# Patient Record
Sex: Male | Born: 1940 | Race: White | Hispanic: No | Marital: Married | Smoking: Former smoker
Health system: Southern US, Community
[De-identification: ages and names within clinical notes are randomized; demographics above are authoritative.]

## PROBLEM LIST (undated history)

## (undated) DIAGNOSIS — K219 Gastro-esophageal reflux disease without esophagitis: Secondary | ICD-10-CM

## (undated) DIAGNOSIS — D3500 Benign neoplasm of unspecified adrenal gland: Secondary | ICD-10-CM

## (undated) DIAGNOSIS — I10 Essential (primary) hypertension: Secondary | ICD-10-CM

## (undated) DIAGNOSIS — G709 Myoneural disorder, unspecified: Secondary | ICD-10-CM

## (undated) DIAGNOSIS — Z8489 Family history of other specified conditions: Secondary | ICD-10-CM

## (undated) DIAGNOSIS — K648 Other hemorrhoids: Secondary | ICD-10-CM

## (undated) DIAGNOSIS — M1990 Unspecified osteoarthritis, unspecified site: Secondary | ICD-10-CM

## (undated) DIAGNOSIS — A0472 Enterocolitis due to Clostridium difficile, not specified as recurrent: Secondary | ICD-10-CM

## (undated) DIAGNOSIS — E7800 Pure hypercholesterolemia, unspecified: Secondary | ICD-10-CM

## (undated) HISTORY — PX: COLONOSCOPY: SHX174

## (undated) HISTORY — DX: Benign neoplasm of unspecified adrenal gland: D35.00

## (undated) HISTORY — DX: Enterocolitis due to Clostridium difficile, not specified as recurrent: A04.72

## (undated) HISTORY — DX: Other hemorrhoids: K64.8

---

## 2008-06-20 ENCOUNTER — Ambulatory Visit: Payer: Self-pay | Admitting: Gastroenterology

## 2009-06-05 ENCOUNTER — Ambulatory Visit: Payer: Self-pay | Admitting: Internal Medicine

## 2009-06-05 ENCOUNTER — Encounter: Admission: RE | Admit: 2009-06-05 | Discharge: 2009-06-05 | Payer: Self-pay | Admitting: Internal Medicine

## 2009-08-04 ENCOUNTER — Ambulatory Visit: Payer: Self-pay | Admitting: Internal Medicine

## 2009-12-08 ENCOUNTER — Ambulatory Visit: Payer: Self-pay | Admitting: Internal Medicine

## 2009-12-25 ENCOUNTER — Ambulatory Visit: Payer: Self-pay | Admitting: Pulmonary Disease

## 2009-12-25 DIAGNOSIS — R519 Headache, unspecified: Secondary | ICD-10-CM | POA: Insufficient documentation

## 2009-12-25 DIAGNOSIS — E785 Hyperlipidemia, unspecified: Secondary | ICD-10-CM

## 2009-12-25 DIAGNOSIS — R51 Headache: Secondary | ICD-10-CM

## 2009-12-25 DIAGNOSIS — I10 Essential (primary) hypertension: Secondary | ICD-10-CM

## 2009-12-25 DIAGNOSIS — J309 Allergic rhinitis, unspecified: Secondary | ICD-10-CM

## 2010-01-12 ENCOUNTER — Encounter: Payer: Self-pay | Admitting: Pulmonary Disease

## 2010-01-14 ENCOUNTER — Ambulatory Visit: Payer: Self-pay | Admitting: Pulmonary Disease

## 2010-01-22 DIAGNOSIS — G4733 Obstructive sleep apnea (adult) (pediatric): Secondary | ICD-10-CM

## 2010-06-08 ENCOUNTER — Ambulatory Visit: Payer: Self-pay | Admitting: Internal Medicine

## 2010-07-07 ENCOUNTER — Encounter
Admission: RE | Admit: 2010-07-07 | Discharge: 2010-09-01 | Payer: Self-pay | Source: Home / Self Care | Attending: Internal Medicine | Admitting: Internal Medicine

## 2010-09-01 NOTE — Assessment & Plan Note (Signed)
Summary: consult for possible osa   Copy to:  Marlan Palau Primary Provider/Referring Provider:  Marlan Palau  CC:  Sleep Consult.  History of Present Illness: The pt is a 70y/o male who I have been asked to see for possible osa.  He has been noted to have loud snoring, but no one has mentioned an abnormal breathing pattern during sleep.  He denies any choking arousals. The patient goes to bed at 11 PM, and arises at 6 AM to start his day. He feels that he is rested at least 80% of the time, but does note a very dry mouth. He denies any significant sleep pressure or alertness issues during the day. He does frequently take an afternoon nap, but it is not something that is a requirement. He denies any sleepiness issues in the evenings while watching television or reading, and has no issues with driving. He states that his weight is 5 pounds less from 2 years ago. His sleepiness score today is borderline at 10.  Preventive Screening-Counseling & Management  Alcohol-Tobacco     Smoking Status: quit  Current Medications (verified): 1)  Diovan Hct 160-12.5 Mg Tabs (Valsartan-Hydrochlorothiazide) .... Take 1 Tablet By Mouth Once A Day 2)  Lipitor 20 Mg Tabs (Atorvastatin Calcium) .... Take 1 Tablet By Mouth Once A Day 3)  Dovonex 0.005 % Crea (Calcipotriene) .... Apply To Rash Two Times A Day 4)  Triamcinolone Acetonide 0.1 % Crea (Triamcinolone Acetonide) .... Apply To Rash Two Times A Day As Needed 5)  Fluticasone Propionate 50 Mcg/act Susp (Fluticasone Propionate) .Marland Kitchen.. 1 Spray in Each Nostril Once Daily 6)  Multivitamins  Tabs (Multiple Vitamin) .... Take 1 Tablet By Mouth Once A Day 7)  Glucosamine-Chondroitin  Caps (Glucosamine-Chondroit-Vit C-Mn) .... Take 1 Tablet By Mouth Two Times A Day 8)  Fish Oil 1000 Mg Caps (Omega-3 Fatty Acids) .... Take 1 Tablet By Mouth Three Times A Day 9)  Advil Pm 200-38 Mg Tabs (Ibuprofen-Diphenhydramine Cit) .... Take 1 Tab By Mouth At Bedtime As Needed 10)   Aspirin 81 Mg Tbec (Aspirin) .... Take By Mouth As Needed 11)  Vitamin D3 1000 Unit Caps (Cholecalciferol) .... Take 1 Tablet By Mouth Once A Day  Allergies (verified): No Known Drug Allergies  Past History:  Past Medical History:  HEADACHE, CHRONIC (ICD-784.0) HYPERTENSION (ICD-401.9) HYPERLIPIDEMIA (ICD-272.4) ALLERGIC RHINITIS (ICD-477.9)    Past Surgical History: none per pt  Family History: Reviewed history and no changes required. emphysema: grandfather cancer: sister (gallbladder)   Social History: Reviewed history and no changes required. Patient states former smoker.  started at age 59.  1 to 1 1/2 ppd.  quit 1983. pt is married. pt has children. pt is a retired Banker.  Smoking Status:  quit  Review of Systems       The patient complains of non-productive cough, tooth/dental problems, headaches, nasal congestion/difficulty breathing through nose, itching, and joint stiffness or pain.  The patient denies shortness of breath with activity, shortness of breath at rest, productive cough, coughing up blood, chest pain, irregular heartbeats, acid heartburn, indigestion, loss of appetite, weight change, abdominal pain, difficulty swallowing, sore throat, sneezing, ear ache, anxiety, depression, hand/feet swelling, rash, change in color of mucus, and fever.    Vital Signs:  Patient profile:   69 year old male Height:      71 inches Weight:      169 pounds BMI:     23.66 O2 Sat:      94 %  on Room air Temp:     97.8 degrees F oral Pulse rate:   87 / minute BP sitting:   120 / 78  (left arm) Cuff size:   regular  Vitals Entered By: Arman Filter LPN (Dec 25, 2009 11:29 AM)  O2 Flow:  Room air CC: Sleep Consult Comments Medications reviewed with patient Arman Filter LPN  Dec 25, 2009 11:29 AM    Physical Exam  General:  ow male in nad Eyes:  PERRLA and EOMI.   Nose:  deviated septum to left with narrowing Mouth:  elongation of uvula, normal  soft palate Neck:  no jvd, tmg, LN Lungs:  clear to auscultation Heart:  rrr, no mrg Abdomen:  soft and nontender, bs+ Extremities:  no edema or cyanosis pulses intact distally Neurologic:  alert and oriented, moves all 4.   Impression & Recommendations:  Problem # 1:  SNORING (ICD-786.09) the pt has loud snoring, but it is unclear if he has clinically significant osa.  He is not overly sleepy during the day, and feels that he has restorative sleep each am.  My suspicion overall is low, but we can easily screen him at home with apnea link device.  I have had a long discussion with the pt about sleep apnea, including its impact on QOL and CV health.  Will let him know results when available.  I have encouraged him to work on weight loss.  Medications Added to Medication List This Visit: 1)  Diovan Hct 160-12.5 Mg Tabs (Valsartan-hydrochlorothiazide) .... Take 1 tablet by mouth once a day 2)  Lipitor 20 Mg Tabs (Atorvastatin calcium) .... Take 1 tablet by mouth once a day 3)  Dovonex 0.005 % Crea (Calcipotriene) .... Apply to rash two times a day 4)  Triamcinolone Acetonide 0.1 % Crea (Triamcinolone acetonide) .... Apply to rash two times a day as needed 5)  Fluticasone Propionate 50 Mcg/act Susp (Fluticasone propionate) .Marland Kitchen.. 1 spray in each nostril once daily 6)  Multivitamins Tabs (Multiple vitamin) .... Take 1 tablet by mouth once a day 7)  Glucosamine-chondroitin Caps (Glucosamine-chondroit-vit c-mn) .... Take 1 tablet by mouth two times a day 8)  Fish Oil 1000 Mg Caps (Omega-3 fatty acids) .... Take 1 tablet by mouth three times a day 9)  Advil Pm 200-38 Mg Tabs (Ibuprofen-diphenhydramine cit) .... Take 1 tab by mouth at bedtime as needed 10)  Aspirin 81 Mg Tbec (Aspirin) .... Take by mouth as needed 11)  Vitamin D3 1000 Unit Caps (Cholecalciferol) .... Take 1 tablet by mouth once a day  Other Orders: Consultation Level IV (16109) Misc. Referral (Misc. Ref)  Patient  Instructions: 1)  will check screening home sleep study to rule out sleep apnea.  I will call you with results. 2)  work on weight loss 3)  stay off back while sleeping if you wish to minimize snoring.

## 2010-09-01 NOTE — Assessment & Plan Note (Signed)
Summary: apnea link shows mild osa with AHI 11/hr.   Copy to:  Marlan Palau Primary Provider/Referring Provider:  Marlan Palau   History of Present Illness: The pt is being evaluated for possible osa with apnea link device.  This is his third attempt, with the previous being prematurely aborted due to the patient pulling the sensors off.  His most recent attempt shows adequate evaluation period.  1) flow evaluation period of 5hrs and 2) 7 obstructive apneas were noted, along with 54 hypopneas.  His AHI was 11/hr. 3) low sat 82%, but no prolonged desaturation below 88%.  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  I have explained to him this is not a significant health risk for him, and therefore he could take some time to work on weight loss if this is not overly impacting his QOL.  He could also consider surgery, dental appliance, and cpap.  He would like to work on weight loss for the next 6mos, and will give me updates on his progress.  Orders: Sleep Std Airflow/Heartrate and O2 SAT unattended (16073)

## 2010-09-28 ENCOUNTER — Ambulatory Visit: Payer: Self-pay | Admitting: Internal Medicine

## 2010-12-10 ENCOUNTER — Ambulatory Visit (INDEPENDENT_AMBULATORY_CARE_PROVIDER_SITE_OTHER): Payer: Medicare Other | Admitting: Internal Medicine

## 2010-12-10 ENCOUNTER — Ambulatory Visit: Payer: BC Managed Care – PPO | Admitting: Internal Medicine

## 2010-12-10 ENCOUNTER — Encounter: Payer: Self-pay | Admitting: Internal Medicine

## 2010-12-10 VITALS — BP 116/84 | HR 76 | Temp 97.1°F | Wt 203.0 lb

## 2010-12-10 DIAGNOSIS — L408 Other psoriasis: Secondary | ICD-10-CM

## 2010-12-10 DIAGNOSIS — L409 Psoriasis, unspecified: Secondary | ICD-10-CM

## 2010-12-10 DIAGNOSIS — E119 Type 2 diabetes mellitus without complications: Secondary | ICD-10-CM | POA: Insufficient documentation

## 2010-12-10 DIAGNOSIS — L219 Seborrheic dermatitis, unspecified: Secondary | ICD-10-CM

## 2010-12-10 DIAGNOSIS — R29898 Other symptoms and signs involving the musculoskeletal system: Secondary | ICD-10-CM | POA: Insufficient documentation

## 2010-12-10 DIAGNOSIS — M6281 Muscle weakness (generalized): Secondary | ICD-10-CM

## 2010-12-10 DIAGNOSIS — E785 Hyperlipidemia, unspecified: Secondary | ICD-10-CM

## 2010-12-10 LAB — HEMOGLOBIN A1C: Mean Plasma Glucose: 117 mg/dL — ABNORMAL HIGH (ref <117)

## 2010-12-10 MED ORDER — ATORVASTATIN CALCIUM 20 MG PO TABS: 20.0000 mg | ORAL_TABLET | Freq: Every day | ORAL | Status: DC

## 2010-12-10 NOTE — Progress Notes (Signed)
  Subjective:    Patient ID: Manuel [name], male    DOB: [birth year], 70 y.o.   MRN: [id number]  HPI 70 year old W male with Hypertension, Hyperlipidemia, AODM for 6 month recheck.  BP stable on Diovan HCT 160/12.5 mg daily, Lipitor 20 mg daily and diet controlled DM. Accuchecks have been excellent. He has lost 15 pounds since Nov. 2011. Has cut down on ice cream. 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  Also, history of nonspecific neuromuscular disease. Cannot close right hand completely. Cannot grip with Right hand.  Does mow his lawn. Left hand works fine. Has not wanted to pursue further evaluation.  Also, psoriasis and seborrhea of ears and face.    Review of Systems  Eyes: Negative for visual disturbance.  Respiratory: Negative for shortness of breath.   Cardiovascular: Negative for chest pain.  Musculoskeletal: Negative for gait problem.  Neurological: Positive for weakness. Negative for tremors, facial asymmetry, speech difficulty, light-headedness, numbness and headaches.  Psychiatric/Behavioral: Negative for confusion, dysphoric mood, decreased concentration and agitation.       Objective:   Physical Exam  Constitutional: He is oriented to person, place, and time. He appears well-developed and well-nourished.  Cardiovascular: Normal rate, regular rhythm and normal heart sounds.  Exam reveals no gallop.   No murmur heard. Pulmonary/Chest: No respiratory distress. He has no wheezes. He has no rales.  Neurological: He is alert and oriented to person, place, and time. He has normal reflexes. No cranial nerve deficit. He exhibits normal muscle tone.  Skin: Rash noted.  Psychiatric: He has a normal mood and affect. His behavior is normal. Judgment and thought content normal.     Psoriatic rash on elbows, seborrhea on face and ears controlled with Devonex.  No change in neuro exam other then cannot grip with right hand and cannot close it at all. No diabetic retinopathy. No diabetic  fotr problems.     Assessment & Plan:  Hyperlipidemia- lipid panel and liver functions drawn and are pending on Lipitor 20 mg daily AODM- diet controlled AIC pending              Hypertension well controlled on Diovan/HCT Non-specific neuromuscular disease right upper extremity  Recent root canal  -failed and tooth was extracted yesterday  RTC in 6 months. Doing well. Immunizations up to date. Will get flu vaccine in 6 months.

## 2010-12-11 LAB — LIPID PANEL
Cholesterol: 164 mg/dL (ref 0–200)
HDL: 41 mg/dL (ref >39)
LDL Cholesterol: 95 mg/dL (ref 0–99)
Total CHOL/HDL Ratio: 4 Ratio
Triglycerides: 139 mg/dL (ref <150)
VLDL: 28 mg/dL (ref 0–40)

## 2010-12-11 LAB — HEPATIC FUNCTION PANEL
Albumin: 4.7 g/dL (ref 3.5–5.2)
Alkaline Phosphatase: 59 U/L (ref 39–117)
Total Bilirubin: 1.2 mg/dL (ref 0.3–1.2)

## 2010-12-17 ENCOUNTER — Ambulatory Visit: Payer: BC Managed Care – PPO | Admitting: Internal Medicine

## 2010-12-17 ENCOUNTER — Ambulatory Visit: Payer: Medicare Other | Admitting: Internal Medicine

## 2011-04-05 ENCOUNTER — Other Ambulatory Visit: Payer: Self-pay | Admitting: Internal Medicine

## 2011-05-06 ENCOUNTER — Encounter: Payer: Self-pay | Admitting: Internal Medicine

## 2011-06-11 ENCOUNTER — Other Ambulatory Visit: Payer: Medicare Other | Admitting: Internal Medicine

## 2011-06-11 DIAGNOSIS — E785 Hyperlipidemia, unspecified: Secondary | ICD-10-CM

## 2011-06-11 DIAGNOSIS — I10 Essential (primary) hypertension: Secondary | ICD-10-CM

## 2011-06-11 DIAGNOSIS — R350 Frequency of micturition: Secondary | ICD-10-CM

## 2011-06-11 LAB — CBC WITH DIFFERENTIAL/PLATELET
Basophils Absolute: 0 10*3/uL (ref 0.0–0.1)
Lymphocytes Relative: 30 % (ref 12–46)
Lymphs Abs: 1.9 10*3/uL (ref 0.7–4.0)
MCV: 95.2 fL (ref 78.0–100.0)
Neutro Abs: 3.6 10*3/uL (ref 1.7–7.7)
Neutrophils Relative %: 57 % (ref 43–77)
Platelets: 256 10*3/uL (ref 150–400)
RBC: 4.41 MIL/uL (ref 4.22–5.81)
WBC: 6.3 10*3/uL (ref 4.0–10.5)

## 2011-06-11 LAB — COMPREHENSIVE METABOLIC PANEL
ALT: 23 U/L (ref 0–53)
AST: 22 U/L (ref 0–37)
CO2: 29 mEq/L (ref 19–32)
Calcium: 9.9 mg/dL (ref 8.4–10.5)
Chloride: 101 mEq/L (ref 96–112)
Potassium: 4.5 mEq/L (ref 3.5–5.3)
Sodium: 139 mEq/L (ref 135–145)
Total Protein: 7.3 g/dL (ref 6.0–8.3)

## 2011-06-11 LAB — PSA: PSA: 2.8 ng/mL (ref <=4.00)

## 2011-06-11 LAB — HEMOGLOBIN A1C
Hgb A1c MFr Bld: 5.8 % — ABNORMAL HIGH (ref <5.7)
Mean Plasma Glucose: 120 mg/dL — ABNORMAL HIGH (ref <117)

## 2011-06-11 LAB — LIPID PANEL: Cholesterol: 159 mg/dL (ref 0–200)

## 2011-06-14 ENCOUNTER — Ambulatory Visit (INDEPENDENT_AMBULATORY_CARE_PROVIDER_SITE_OTHER): Payer: Medicare Other | Admitting: Internal Medicine

## 2011-06-14 ENCOUNTER — Encounter: Payer: Self-pay | Admitting: Internal Medicine

## 2011-06-14 DIAGNOSIS — E559 Vitamin D deficiency, unspecified: Secondary | ICD-10-CM

## 2011-06-14 DIAGNOSIS — G709 Myoneural disorder, unspecified: Secondary | ICD-10-CM

## 2011-06-14 DIAGNOSIS — E785 Hyperlipidemia, unspecified: Secondary | ICD-10-CM

## 2011-06-14 DIAGNOSIS — Z0000 Encounter for general adult medical examination without abnormal findings: Secondary | ICD-10-CM

## 2011-06-14 DIAGNOSIS — L409 Psoriasis, unspecified: Secondary | ICD-10-CM

## 2011-06-14 DIAGNOSIS — J309 Allergic rhinitis, unspecified: Secondary | ICD-10-CM

## 2011-06-14 DIAGNOSIS — L408 Other psoriasis: Secondary | ICD-10-CM

## 2011-06-14 DIAGNOSIS — N400 Enlarged prostate without lower urinary tract symptoms: Secondary | ICD-10-CM

## 2011-06-14 DIAGNOSIS — I10 Essential (primary) hypertension: Secondary | ICD-10-CM

## 2011-06-14 DIAGNOSIS — E119 Type 2 diabetes mellitus without complications: Secondary | ICD-10-CM

## 2011-06-14 DIAGNOSIS — G4730 Sleep apnea, unspecified: Secondary | ICD-10-CM

## 2011-06-14 LAB — POCT URINALYSIS DIPSTICK
Blood, UA: NEGATIVE
Glucose, UA: NEGATIVE
Nitrite, UA: NEGATIVE
Protein, UA: NEGATIVE
Spec Grav, UA: 1.01
Urobilinogen, UA: NEGATIVE

## 2011-06-30 ENCOUNTER — Other Ambulatory Visit: Payer: Self-pay

## 2011-06-30 DIAGNOSIS — E785 Hyperlipidemia, unspecified: Secondary | ICD-10-CM

## 2011-06-30 MED ORDER — ATORVASTATIN CALCIUM 20 MG PO TABS: 20.0000 mg | ORAL_TABLET | Freq: Every day | ORAL | Status: DC

## 2011-08-02 ENCOUNTER — Encounter: Payer: Self-pay | Admitting: Internal Medicine

## 2011-08-02 NOTE — Patient Instructions (Signed)
Continue current medications and return in 6 months. We are going to refer you to urologist to have prostate checked.

## 2011-08-11 NOTE — Progress Notes (Signed)
Subjective:    Patient ID: Manuel [name], male    DOB: [birth year], 71 y.o.   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 Has been a patient here since November 2010. History of hypertension, hyperlipidemia, psoriasis, seborrhea of face and ears, unspecified neuromuscular disease which occurred in 1979. Right arm became weak and atrophied. He cannot flex his right hand and he does have some fasciculations in the hand area. He is right-handed. He underwent a myelogram in an attempt to obtain diagnosis. Never had a concrete diagnosis for this problem. 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 He had a spinal tap at that time. Had an occipital headache and backache. Then starting in the fall of 1978 and 1979 he developed a right arm problem. History of fractured right ankle 1959. Since 1982 has been on antihypertensive medication.  Had Pneumovax immunization November 2010, tetanus immunization November 2011. Gets annual influenza immunization. Has declined Zostavax in the past. Had diabetic eye exam November 2012. Colonoscopy was done June 2010 by Dr. Bosie Clos and repeat study advised him 20/20.  History of parotiditis January 2011. History of mild sleep apnea not requiring CPAP but seen by Dr. Shelle Iron. Patient has lost slight amount of weight since last physical exam. Is trying to watch his diet a bit more carefully. Weight 218 pounds November 2011. History of vitamin D deficiency diagnosed November 2010  with level of 18. Recommended calcium and vitamin D supplement.    Review of Systems  Constitutional: Negative.   HENT: Negative.   Eyes: Negative.   Respiratory: Negative.   Cardiovascular: Negative.   Gastrointestinal: Negative.   Genitourinary: Negative.   Musculoskeletal: Negative.   Skin:       Psoriasis and seborrhea  Neurological:       Right hand  weakness which is chronic  Hematological: Negative.   Psychiatric/Behavioral: Negative.        Objective:   Physical Exam  Vitals reviewed. Constitutional: He is oriented to person, place, and time. He appears well-developed and well-nourished. No distress.  HENT:  Head: Normocephalic and atraumatic.  Right Ear: External ear normal.  Left Ear: External ear normal.  Mouth/Throat: Oropharynx is clear and moist.  Eyes: Conjunctivae and EOM are normal. Pupils are equal, round, and reactive to light. No scleral icterus.  Neck: Neck supple. No JVD present. No thyromegaly present.  Cardiovascular: Normal rate, regular rhythm, normal heart sounds and intact distal pulses.   No murmur heard.      No bruits  Pulmonary/Chest: Effort normal and breath sounds normal.  Abdominal: Soft. Bowel sounds are normal. He exhibits no distension and no mass. There is no rebound.  Genitourinary: Penis normal.       Enlarged prostate? Abnormal right lobe  Musculoskeletal: He exhibits no edema.       Diabetic foot exam-no ulcers and pulses intact. Flex right hand. Grip is extremely weak. Fasciculations occasionally noted right arm. Right arm is atrophied.  Neurological: He is alert and oriented to person, place, and time. He has normal reflexes. No cranial nerve deficit. Coordination normal.  Skin: Skin is warm and dry.  Psychiatric: He has a normal mood and affect. His behavior is normal. Judgment and thought content normal.          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  Hyperlipidemia  Hypertension  Vitamin D deficiency  Mild sleep apnea  Seborrhea  Psoriasis  Diabetes mellitus diet controlled  Abnormal prostate exam? BPH versus abnormal right lobe- refer to urologist  Plan: Return in 6 months. Will need fasting lipid panel liver functions and office visit at that time along with hemoglobin A1c and urine for  microalbuminuria

## 2011-08-20 ENCOUNTER — Other Ambulatory Visit: Payer: Self-pay | Admitting: Internal Medicine

## 2011-09-28 ENCOUNTER — Other Ambulatory Visit: Payer: Self-pay | Admitting: Internal Medicine

## 2011-10-25 ENCOUNTER — Other Ambulatory Visit: Payer: Self-pay | Admitting: Internal Medicine

## 2011-12-23 ENCOUNTER — Other Ambulatory Visit: Payer: Self-pay

## 2011-12-23 DIAGNOSIS — E785 Hyperlipidemia, unspecified: Secondary | ICD-10-CM

## 2011-12-23 MED ORDER — ATORVASTATIN CALCIUM 20 MG PO TABS: 20.0000 mg | ORAL_TABLET | Freq: Every day | ORAL | Status: DC

## 2012-01-04 ENCOUNTER — Ambulatory Visit (INDEPENDENT_AMBULATORY_CARE_PROVIDER_SITE_OTHER): Payer: Medicare Other | Admitting: Internal Medicine

## 2012-01-04 ENCOUNTER — Encounter: Payer: Self-pay | Admitting: Internal Medicine

## 2012-01-04 VITALS — BP 96/60 | HR 80 | Temp 97.8°F | Wt 205.0 lb

## 2012-01-04 DIAGNOSIS — K1120 Sialoadenitis, unspecified: Secondary | ICD-10-CM

## 2012-01-06 ENCOUNTER — Telehealth: Payer: Self-pay | Admitting: Internal Medicine

## 2012-01-31 NOTE — Patient Instructions (Addendum)
Try lemon candy or sucking on lemon. Take antibiotics as directed. See ENT physician if symptoms do not resolve.

## 2012-01-31 NOTE — Progress Notes (Signed)
  Subjective:    Patient ID: Manuel [name], male    DOB: [birth year], 71 y.o.   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 Has noticed it over the past couple of days intermittently after a meal. 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 Generally improves with an antibiotic.   Review of Systems     Objective:   Physical Exam HEENT exam TMs and pharynx are clear. Soft tissue swelling left face without erythema of face. No nodules in cheek appreciated.        Assessment & Plan:  Left parotiditis  Plan: Keflex 500 mg by mouth 4 times a day for 7 days. Try sucking on lemon eating lemon candy to improve symptoms. Refer to ENT physician if symptoms do not improve in 48 hours.

## 2012-08-29 ENCOUNTER — Other Ambulatory Visit: Payer: Self-pay | Admitting: Internal Medicine

## 2012-09-10 ENCOUNTER — Other Ambulatory Visit: Payer: Self-pay | Admitting: Internal Medicine

## 2013-01-29 ENCOUNTER — Encounter (HOSPITAL_COMMUNITY): Payer: Self-pay | Admitting: Emergency Medicine

## 2013-01-29 ENCOUNTER — Observation Stay (HOSPITAL_COMMUNITY)
Admission: EM | Admit: 2013-01-29 | Discharge: 2013-01-30 | Disposition: A | Discharge status: Home / Self Care | Payer: Medicare Other | Attending: Internal Medicine | Admitting: Internal Medicine

## 2013-01-29 ENCOUNTER — Emergency Department (HOSPITAL_COMMUNITY): Payer: Medicare Other

## 2013-01-29 ENCOUNTER — Telehealth: Payer: Self-pay | Admitting: Internal Medicine

## 2013-01-29 DIAGNOSIS — R079 Chest pain, unspecified: Principal | ICD-10-CM | POA: Diagnosis present

## 2013-01-29 DIAGNOSIS — Z79899 Other long term (current) drug therapy: Secondary | ICD-10-CM | POA: Insufficient documentation

## 2013-01-29 DIAGNOSIS — E785 Hyperlipidemia, unspecified: Secondary | ICD-10-CM | POA: Diagnosis present

## 2013-01-29 DIAGNOSIS — R0602 Shortness of breath: Secondary | ICD-10-CM | POA: Insufficient documentation

## 2013-01-29 DIAGNOSIS — R29898 Other symptoms and signs involving the musculoskeletal system: Secondary | ICD-10-CM | POA: Diagnosis present

## 2013-01-29 DIAGNOSIS — I10 Essential (primary) hypertension: Secondary | ICD-10-CM | POA: Diagnosis present

## 2013-01-29 HISTORY — DX: Family history of other specified conditions: Z84.89

## 2013-01-29 HISTORY — DX: Essential (primary) hypertension: I10

## 2013-01-29 HISTORY — DX: Myoneural disorder, unspecified: G70.9

## 2013-01-29 HISTORY — DX: Gastro-esophageal reflux disease without esophagitis: K21.9

## 2013-01-29 HISTORY — DX: Unspecified osteoarthritis, unspecified site: M19.90

## 2013-01-29 HISTORY — DX: Pure hypercholesterolemia, unspecified: E78.00

## 2013-01-29 LAB — COMPREHENSIVE METABOLIC PANEL
ALT: 27 U/L (ref 0–53)
Alkaline Phosphatase: 89 U/L (ref 39–117)
BUN: 13 mg/dL (ref 6–23)
CO2: 28 mEq/L (ref 19–32)
Calcium: 9.8 mg/dL (ref 8.4–10.5)
GFR calc Af Amer: >90 mL/min (ref >90)
GFR calc non Af Amer: 89 mL/min — ABNORMAL LOW (ref >90)
Glucose, Bld: 112 mg/dL — ABNORMAL HIGH (ref 70–99)
Sodium: 138 mEq/L (ref 135–145)

## 2013-01-29 LAB — CBC
HCT: 42.6 % (ref 39.0–52.0)
Hemoglobin: 14.6 g/dL (ref 13.0–17.0)
MCH: 30.8 pg (ref 26.0–34.0)
MCHC: 34.3 g/dL (ref 30.0–36.0)
RDW: 12.8 % (ref 11.5–15.5)

## 2013-01-29 LAB — MAGNESIUM: Magnesium: 2.4 mg/dL (ref 1.5–2.5)

## 2013-01-29 LAB — CBC WITH DIFFERENTIAL/PLATELET
Basophils Absolute: 0 10*3/uL (ref 0.0–0.1)
Eosinophils Absolute: 0.2 10*3/uL (ref 0.0–0.7)
Eosinophils Relative: 3 % (ref 0–5)
Lymphs Abs: 1.5 10*3/uL (ref 0.7–4.0)
MCH: 31.2 pg (ref 26.0–34.0)
MCV: 88.8 fL (ref 78.0–100.0)
Neutrophils Relative %: 63 % (ref 43–77)
Platelets: 215 10*3/uL (ref 150–400)
RBC: 4.46 MIL/uL (ref 4.22–5.81)
RDW: 12.8 % (ref 11.5–15.5)
WBC: 6.2 10*3/uL (ref 4.0–10.5)

## 2013-01-29 LAB — POCT I-STAT TROPONIN I: Troponin i, poc: 0.03 ng/mL (ref 0.00–0.08)

## 2013-01-29 LAB — POCT I-STAT, CHEM 8
HCT: 41 % (ref 39.0–52.0)
Hemoglobin: 13.9 g/dL (ref 13.0–17.0)
Potassium: 3.8 mEq/L (ref 3.5–5.1)
Sodium: 138 mEq/L (ref 135–145)
TCO2: 26 mmol/L (ref 0–100)

## 2013-01-29 LAB — LIPID PANEL
LDL Cholesterol: 100 mg/dL — ABNORMAL HIGH (ref 0–99)
VLDL: 27 mg/dL (ref 0–40)

## 2013-01-29 LAB — PROTIME-INR
INR: 1 (ref 0.00–1.49)
Prothrombin Time: 13 seconds (ref 11.6–15.2)

## 2013-01-29 LAB — TROPONIN I
Troponin I: <0.3 ng/mL (ref <0.30)
Troponin I: <0.3 ng/mL (ref <0.30)

## 2013-01-29 MED ORDER — ONDANSETRON HCL 4 MG/2ML IJ SOLN: 4.0000 mg | Freq: Four times a day (QID) | INTRAMUSCULAR | Status: DC | PRN

## 2013-01-29 MED ORDER — ASPIRIN 81 MG PO CHEW
324.0000 mg | CHEWABLE_TABLET | Freq: Once | ORAL | Status: AC
  Administered 2013-01-29: 324 mg via ORAL
  Filled 2013-01-29: qty 4

## 2013-01-29 MED ORDER — ASPIRIN 81 MG PO CHEW: 324.0000 mg | CHEWABLE_TABLET | ORAL | Status: AC

## 2013-01-29 MED ORDER — LISINOPRIL 5 MG PO TABS
5.0000 mg | ORAL_TABLET | Freq: Every day | ORAL | Status: DC
  Filled 2013-01-29: qty 1

## 2013-01-29 MED ORDER — NITROGLYCERIN 0.4 MG SL SUBL: 0.4000 mg | SUBLINGUAL_TABLET | SUBLINGUAL | Status: DC | PRN

## 2013-01-29 MED ORDER — ASPIRIN 300 MG RE SUPP
300.0000 mg | RECTAL | Status: AC
  Filled 2013-01-29: qty 1

## 2013-01-29 MED ORDER — IRBESARTAN 75 MG PO TABS
75.0000 mg | ORAL_TABLET | Freq: Every day | ORAL | Status: DC
  Administered 2013-01-30: 75 mg via ORAL
  Filled 2013-01-29 (×2): qty 1

## 2013-01-29 MED ORDER — ACETAMINOPHEN 325 MG PO TABS: 650.0000 mg | ORAL_TABLET | ORAL | Status: DC | PRN

## 2013-01-29 MED ORDER — ENOXAPARIN SODIUM 40 MG/0.4ML ~~LOC~~ SOLN
40.0000 mg | SUBCUTANEOUS | Status: DC
  Administered 2013-01-29: 40 mg via SUBCUTANEOUS
  Filled 2013-01-29 (×2): qty 0.4

## 2013-01-29 NOTE — ED Notes (Signed)
Reports he has had some increased indigestion the past few days; gas but no relief.

## 2013-01-29 NOTE — Progress Notes (Signed)
Pt placed on ACE today. Prior to first dose, pt informed RN of hx of cough with ACE in past. Pt takes ARB at home without issues. D/C Lisinopril. Start Avapro at lowest dose tonight.  Jimmye Norman, NP [facility]

## 2013-01-29 NOTE — Telephone Encounter (Signed)
Advise to go to ED for evaluation

## 2013-01-29 NOTE — ED Notes (Signed)
Several weeks chest tightness; uneasiness and shortness of breath. Was at [facility] and he said he felt "that feeling" again and decided to go home.

## 2013-01-29 NOTE — ED Provider Notes (Signed)
History    CSN: [id number] Arrival date & time 01/29/13  1100  First MD Initiated Contact with Patient 01/29/13 1148     Chief Complaint  Patient presents with  . Chest Pain   (Consider location/radiation/quality/duration/timing/severity/associated sxs/prior Treatment) Patient is a 72 y.o. male presenting with chest pain.  Chest Pain  Pt with history of HTN, HLD but no known CAD reports intermittent episodes of SOB and chest 'discomfort' for the last couple of weeks, but he had a particularly severe episode earlier today at [facility]. He called PCP who recommended he come to the ED for evaluation. He states his discomfort is not a pain, but more of a pressure or tightness. No particular provoking or relieving factors. Has never had stress test or cath. Past Medical History  Diagnosis Date  . Hypertension   . Hypercholesteremia    No past surgical history on file. No family history on file. History  Substance Use Topics  . Smoking status: Former Smoker -- 1.00 packs/day for 20 years    Types: Cigarettes    Quit date: 05/11/1982  . Smokeless tobacco: Never Used  . Alcohol Use: 0.6 oz/week    1 Glasses of wine per week     Comment: socially    Review of Systems  Cardiovascular: Positive for chest pain.   All other systems reviewed and are negative except as noted in HPI.   Allergies  Review of patient's allergies indicates no known allergies.  Home Medications   Current Outpatient Rx  Name  Route  Sig  Dispense  Refill  . ACCU-CHEK AVIVA PLUS test strip      TEST EVERY DAY   100 strip   2   . aspirin 81 MG EC tablet   Oral   Take 81 mg by mouth daily.           Marland Kitchen atorvastatin (LIPITOR) 20 MG tablet   Oral   Take 1 tablet (20 mg total) by mouth daily.   90 tablet   3   . calcipotriene (DOVONEX) 0.005 % cream   Topical   Apply topically 2 (two) times daily.           . Cholecalciferol (VITAMIN D) 2000 UNITS CAPS   Oral   Take by mouth.           .  [employer] LANCETS THIN 26G MISC      TEST EVERY DAY   100 each   6   . fish oil-omega-3 fatty acids 1000 MG capsule   Oral   Take 3 g by mouth daily.           . fluticasone (FLONASE) 50 MCG/ACT nasal spray   Nasal   2 sprays by Nasal route daily.           Marland Kitchen GLUCOS-CHON-MSM-CA-C-CTCL-SECU PO   Oral   Take by mouth daily.           Marland Kitchen ketoconazole (NIZORAL) 2 % cream      USE ON FACE AND EARS ONCE DAILY   60 g   0   . triamcinolone cream (KENALOG) 0.1 %      USE SPARINGLY TWICE A DAY   480 g   0   . valsartan-hydrochlorothiazide (DIOVAN-HCT) 160-12.5 MG per tablet      TAKE 1 TABLET BY MOUTH EVERY DAY   90 tablet   3    BP 157/89  Pulse 97  Temp(Src) 97.9 F (36.6 C) (Oral)  Resp  18  SpO2 97% Physical Exam  Nursing note and vitals reviewed. Constitutional: He is oriented to person, place, and time. He appears well-developed and well-nourished.  HENT:  Head: Normocephalic and atraumatic.  Eyes: EOM are normal. Pupils are equal, round, and reactive to light.  Neck: Normal range of motion. Neck supple.  Cardiovascular: Normal rate, normal heart sounds and intact distal pulses.   Pulmonary/Chest: Effort normal and breath sounds normal.  Abdominal: Bowel sounds are normal. He exhibits no distension. There is no tenderness.  Musculoskeletal: Normal range of motion. He exhibits no edema and no tenderness.  Neurological: He is alert and oriented to person, place, and time. He has normal strength. No cranial nerve deficit or sensory deficit.  Skin: Skin is warm and dry. No rash noted.  Psychiatric: He has a normal mood and affect.    ED Course  Procedures (including critical care time) Labs Reviewed  POCT I-STAT, CHEM 8 - Abnormal; Notable for the following:    Glucose, Bld 115 (*)    All other components within normal limits  CBC WITH DIFFERENTIAL  POCT I-STAT TROPONIN I   Dg Chest 2 View  01/29/2013   *RADIOLOGY REPORT*  Clinical Data: 72 year old male  chest pain.  CHEST - 2 VIEW  Comparison: 06/05/2009 and earlier.  Findings: Stable and normal lung volumes.  Cardiac size and mediastinal contours are within normal limits.  Visualized tracheal air column is within normal limits.  No pneumothorax or pulmonary edema.  No pleural effusion or consolidation.  No acute or confluent pulmonary opacity. No acute osseous abnormality identified.  Calcified atherosclerosis of the abdominal aorta.  IMPRESSION: No acute cardiopulmonary abnormality.   Original Report Authenticated By: Erskine Speed, M.D.   1. Chest pain   2. Right hand weakness   3. Unspecified essential hypertension   4. Diabetes mellitus   5. Other and unspecified hyperlipidemia     MDM   Date: 01/29/2013  Rate: 93  Rhythm: normal sinus rhythm  QRS Axis: normal  Intervals: normal  ST/T Wave abnormalities: normal  Conduction Disutrbances: none  Narrative Interpretation: unremarkable  Pt with neg labs in the ED. Plan admission for rule out.     Charles B. Bernette Mayers, MD 01/30/13 2116

## 2013-01-29 NOTE — H&P (Signed)
[facility] History and Physical  Manuel [name] ZOX:[id number] DOB: Feb 21, 1941 DOA: 01/29/2013  Referring physician:  PCP: Margaree Mackintosh, MD  Specialists:   Chief Complaint: SOB with exertion  HPI: Manuel [name] is a 72 y.o. 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 Per patient increasing episodes of SOB with exertion which started approximately 2 months ago, relieved with rest. Negative chest pain. Negative pedal edema Has never had stress test or cath. Lives at home with wife.  Review of Systems: The patient denies anorexia, fever, weight loss,, vision loss, decreased hearing, hoarseness, chest pain, syncope, peripheral edema, balance deficits, hemoptysis, abdominal pain, melena, hematochezia, severe indigestion/heartburn, hematuria, incontinence, genital sores, muscle weakness, suspicious skin lesions, transient blindness, difficulty walking, depression, unusual weight change, abnormal bleeding, enlarged lymph nodes, angioedema, and breast masses.   Past Medical History  Diagnosis Date  . Hypertension   . Hypercholesteremia   . Family history of anesthesia complication     MOTHER   . GERD (gastroesophageal reflux disease)   . Neuromuscular disorder     ETIOLOGY UNKNOWN   . Arthritis    Past Surgical History  Procedure Laterality Date  . Colonoscopy     Social History:  reports that he quit smoking about 30 years ago. His smoking use included Cigarettes. He has a 20 pack-year smoking history. He has never used smokeless tobacco. He reports that he drinks about 0.6 ounces of alcohol per week. He reports that he does not use illicit drugs.  No Known Allergies  History reviewed. No pertinent family history.   Prior to Admission medications   Medication Sig Start Date End Date Taking? Authorizing Provider  ACCU-CHEK AVIVA PLUS test strip TEST  EVERY DAY 09/28/11  Yes Margaree Mackintosh, MD  aspirin 81 MG EC tablet Take 81 mg by mouth daily.     Yes Historical Provider, MD  atorvastatin (LIPITOR) 20 MG tablet Take 1 tablet (20 mg total) by mouth daily. 12/23/11  Yes Margaree Mackintosh, MD  calcipotriene (DOVONEX) 0.005 % cream Apply 1 application topically See admin instructions. During week days   Yes Historical Provider, MD  Cholecalciferol (VITAMIN D) 2000 UNITS CAPS Take 1 capsule by mouth daily.    Yes Historical Provider, MD  [employer] LANCETS THIN 26G MISC TEST EVERY DAY 10/25/11  Yes Margaree Mackintosh, MD  ketoconazole (NIZORAL) 2 % cream Apply 1 application topically See admin instructions. On face and ears, on week days   Yes Historical Provider, MD  triamcinolone cream (KENALOG) 0.1 % Apply 1 application topically See admin instructions. Apply once a day on weekends   Yes Historical Provider, MD  valsartan-hydrochlorothiazide (DIOVAN-HCT) 160-12.5 MG per tablet Take 1 tablet by mouth daily.   Yes Historical Provider, MD   Physical Exam: Filed Vitals:   01/29/13 1200 01/29/13 1230 01/29/13 1300 01/29/13 1409  BP: 143/95 126/86 126/80 138/88  Pulse: 90 86 79 82  Temp:    97.6 F (36.4 C)  TempSrc:    Oral  Resp: 16 17 15 18   Height:    5\' 10"  (1.778 m)  Weight:    91.581 kg (201 lb 14.4 oz)  SpO2: 99% 96% 96% 98%     General:  Alert,NAD  Cardiovascular: Regular rhythm and rate, negative murmurs rubs or gallops, DP/PT pulses 2+ bilateral  Respiratory: Clear to auscultation bilateral  Abdomen: Soft  nontender nondistended plus bowel sounds   Labs on Admission:  Basic Metabolic Panel:  Recent Labs Lab 01/29/13 1154  NA 138  K 3.8  CL 100  GLUCOSE 115*  BUN 16  CREATININE 0.90   Liver Function Tests: No results found for this basename: AST, ALT, ALKPHOS, BILITOT, PROT, ALBUMIN,  in the last 168 hours No results found for this basename: LIPASE, AMYLASE,  in the last 168 hours No results found for this basename: AMMONIA,  in  the last 168 hours CBC:  Recent Labs Lab 01/29/13 1142 01/29/13 1154  WBC 6.2  --   NEUTROABS 3.9  --   HGB 13.9 13.9  HCT 39.6 41.0  MCV 88.8  --   PLT 215  --    Cardiac Enzymes: No results found for this basename: CKTOTAL, CKMB, CKMBINDEX, TROPONINI,  in the last 168 hours  BNP (last 3 results) No results found for this basename: PROBNP,  in the last 8760 hours CBG: No results found for this basename: GLUCAP,  in the last 168 hours  Radiological Exams on Admission: Dg Chest 2 View  01/29/2013   *RADIOLOGY REPORT*  Clinical Data: 72 year old male chest pain.  CHEST - 2 VIEW  Comparison: 06/05/2009 and earlier.  Findings: Stable and normal lung volumes.  Cardiac size and mediastinal contours are within normal limits.  Visualized tracheal air column is within normal limits.  No pneumothorax or pulmonary edema.  No pleural effusion or consolidation.  No acute or confluent pulmonary opacity. No acute osseous abnormality identified.  Calcified atherosclerosis of the abdominal aorta.  IMPRESSION: No acute cardiopulmonary abnormality.   Original Report Authenticated By: Erskine Speed, M.D.    EKG: No previous EKG for comparison normal sinus rhythm   Assessment/Plan Principal Problem:   Chest pain Active Problems:   HYPERLIPIDEMIA   HYPERTENSION   Right hand weakness   1. Chest pain; patient admitted per ACS protocol will obtain serial cardiac enzymes, 2-D cardiac echo, exercise stress test. 2. HTN; currently controlled though prehypertensive zone we'll start lisinopril 3. HLD;will wait for results of cholesterol panel  Family Communication: Wife aware of treatment plan  Disposition Plan: ? Time spent: 45 minute Drema Dallas [facility] Pager [phone]  If 7PM-7AM, please contact night-coverage [email or web address] Password [facility] 01/29/2013, 5:32 PM

## 2013-01-29 NOTE — ED Notes (Signed)
Pt returned from radiology.

## 2013-01-30 ENCOUNTER — Telehealth: Payer: Self-pay | Admitting: Internal Medicine

## 2013-01-30 DIAGNOSIS — I517 Cardiomegaly: Secondary | ICD-10-CM

## 2013-01-30 DIAGNOSIS — I10 Essential (primary) hypertension: Secondary | ICD-10-CM

## 2013-01-30 DIAGNOSIS — R072 Precordial pain: Secondary | ICD-10-CM

## 2013-01-30 DIAGNOSIS — R079 Chest pain, unspecified: Secondary | ICD-10-CM

## 2013-01-30 DIAGNOSIS — M6281 Muscle weakness (generalized): Secondary | ICD-10-CM

## 2013-01-30 LAB — TROPONIN I: Troponin I: <0.3 ng/mL (ref <0.30)

## 2013-01-30 LAB — HEMOGLOBIN A1C: Mean Plasma Glucose: 123 mg/dL — ABNORMAL HIGH (ref <117)

## 2013-01-30 MED ORDER — ATORVASTATIN CALCIUM 20 MG PO TABS: 40.0000 mg | ORAL_TABLET | Freq: Every day | ORAL | Status: DC

## 2013-01-30 NOTE — H&P (Signed)
[facility] History and Physical  Manuel [name] ZOX:[id number] DOB: 30-Nov-1940 DOA: 01/29/2013  Referring physician:  PCP: Manuel Mackintosh, MD  Specialists:   Chief Complaint: SOB with exertion  HPI: Manuel [name] is a 72 y.o. 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 Per patient increasing episodes of SOB with exertion which started approximately 2 months ago, relieved with rest. Negative chest pain. Negative pedal edema Has never had stress test or cath. Lives at home with wife. TODAY negative chest pain, negative SOB, overnight. Slept well  Review of Systems: The patient denies anorexia, fever, weight loss,, vision loss, decreased hearing, hoarseness, chest pain, syncope, peripheral edema, balance deficits, hemoptysis, abdominal pain, melena, hematochezia, severe indigestion/heartburn, hematuria, incontinence, genital sores, muscle weakness, suspicious skin lesions, transient blindness, difficulty walking, depression, unusual weight change, abnormal bleeding, enlarged lymph nodes, angioedema, and breast masses.   Past Medical History  Diagnosis Date  . Hypertension   . Hypercholesteremia   . Family history of anesthesia complication     MOTHER   . GERD (gastroesophageal reflux disease)   . Neuromuscular disorder     ETIOLOGY UNKNOWN   . Arthritis    Past Surgical History  Procedure Laterality Date  . Colonoscopy     Social History:  reports that he quit smoking about 30 years ago. His smoking use included Cigarettes. He has a 20 pack-year smoking history. He has never used smokeless tobacco. He reports that he drinks about 0.6 ounces of alcohol per week. He reports that he does not use illicit drugs.  Allergies  Allergen Reactions  . Ace Inhibitors Cough    History reviewed. No pertinent family history.   Prior to Admission medications    Medication Sig Start Date End Date Taking? Authorizing Provider  ACCU-CHEK AVIVA PLUS test strip TEST EVERY DAY 09/28/11  Yes Manuel Mackintosh, MD  aspirin 81 MG EC tablet Take 81 mg by mouth daily.     Yes Historical Provider, MD  atorvastatin (LIPITOR) 20 MG tablet Take 1 tablet (20 mg total) by mouth daily. 12/23/11  Yes Manuel Mackintosh, MD  calcipotriene (DOVONEX) 0.005 % cream Apply 1 application topically See admin instructions. During week days   Yes Historical Provider, MD  Cholecalciferol (VITAMIN D) 2000 UNITS CAPS Take 1 capsule by mouth daily.    Yes Historical Provider, MD  [employer] LANCETS THIN 26G MISC TEST EVERY DAY 10/25/11  Yes Manuel Mackintosh, MD  ketoconazole (NIZORAL) 2 % cream Apply 1 application topically See admin instructions. On face and ears, on week days   Yes Historical Provider, MD  triamcinolone cream (KENALOG) 0.1 % Apply 1 application topically See admin instructions. Apply once a day on weekends   Yes Historical Provider, MD  valsartan-hydrochlorothiazide (DIOVAN-HCT) 160-12.5 MG per tablet Take 1 tablet by mouth daily.   Yes Historical Provider, MD   Physical Exam: Filed Vitals:   01/29/13 1300 01/29/13 1409 01/29/13 2144 01/30/13 0500  BP: 126/80 138/88 106/64   Pulse: 79 82 79   Temp:  97.6 F (36.4 C) 97.7 F (36.5 C)   TempSrc:  Oral Oral   Resp: 15 18 18    Height:  5\' 10"  (1.778 m)    Weight:  91.581 kg (201 lb 14.4 oz)  92 kg (202 lb 13.2 oz)  SpO2: 96% 98% 97%      General:  Alert,NAD  Cardiovascular: Regular rhythm and rate, negative murmurs rubs or gallops, DP/PT pulses 2+ bilateral  Respiratory: Clear to auscultation bilateral  Abdomen: Soft nontender nondistended plus bowel sounds  Musculoskeletal; negative pedal edema   Labs on Admission:  Basic Metabolic Panel:  Recent Labs Lab 01/29/13 1154 01/29/13 1732  NA 138 138  K 3.8 3.7  CL 100 100  CO2  --  28  GLUCOSE 115* 112*  BUN 16 13  CREATININE 0.90 0.78  CALCIUM  --  9.8  MG  --   2.4   Liver Function Tests:  Recent Labs Lab 01/29/13 1732  AST 25  ALT 27  ALKPHOS 89  BILITOT 1.0  PROT 8.1  ALBUMIN 4.3   No results found for this basename: LIPASE, AMYLASE,  in the last 168 hours No results found for this basename: AMMONIA,  in the last 168 hours CBC:  Recent Labs Lab 01/29/13 1142 01/29/13 1154 01/29/13 1732  WBC 6.2  --  7.9  NEUTROABS 3.9  --   --   HGB 13.9 13.9 14.6  HCT 39.6 41.0 42.6  MCV 88.8  --  89.9  PLT 215  --  247   Cardiac Enzymes:  Recent Labs Lab 01/29/13 1749 01/29/13 2236 01/30/13 0505  TROPONINI <0.30 <0.30 <0.30    BNP (last 3 results)  Recent Labs  01/29/13 1749  PROBNP 18.0   CBG: No results found for this basename: GLUCAP,  in the last 168 hours  Radiological Exams on Admission: Dg Chest 2 View  01/29/2013   *RADIOLOGY REPORT*  Clinical Data: 72 year old male chest pain.  CHEST - 2 VIEW  Comparison: 06/05/2009 and earlier.  Findings: Stable and normal lung volumes.  Cardiac size and mediastinal contours are within normal limits.  Visualized tracheal air column is within normal limits.  No pneumothorax or pulmonary edema.  No pleural effusion or consolidation.  No acute or confluent pulmonary opacity. No acute osseous abnormality identified.  Calcified atherosclerosis of the abdominal aorta.  IMPRESSION: No acute cardiopulmonary abnormality.   Original Report Authenticated By: Erskine Speed, M.D.    EKG: No previous EKG for comparison normal sinus rhythm   Assessment/Plan Principal Problem:   Chest pain Active Problems:   HYPERLIPIDEMIA   HYPERTENSION   Right hand weakness   1. Chest pain; spoke with PA Manuel [name] (cardiology) and patient will receive an exercise stress echo this a.m. in study negative the patient could be discharged this afternoon 2. HTN; currently after starting lisinopril patient's BP within AHA guidelines. 3. HLD;will wait for results of cholesterol panel  Family Communication: Wife  aware of treatment plan  Disposition Plan: ? Time spent: 45 minute Manuel [name], Manuel [name] [facility] Pager [phone]  If 7PM-7AM, please contact night-coverage [email or web address] Password [email or web address] 01/30/2013, 8:08 AM

## 2013-01-30 NOTE — Telephone Encounter (Signed)
He has to wait for cardiologist and hospitalist to discharge.   Notes in EPIC Say he was to have stress test today

## 2013-01-30 NOTE — Progress Notes (Signed)
Utilization review complete. Maverick Patman RN CCM Case Mgmt phone [phone] 

## 2013-01-30 NOTE — Discharge Summary (Signed)
Physician Discharge Summary  Manuel [name] XBJ:[id number] DOB: 1941/03/11 DOA: 01/29/2013  PCP: Margaree Mackintosh, MD  Admit date: 01/29/2013 Discharge date: 01/30/2013  Time spent: 30 minutes  Recommendations for Outpatient Follow-up:  1. Chest pain; 2-D echo was negative, patient will be discharged with instructions to maintain his blood pressure, and cholesterol p 2. HTN; currently controlled sent home on home medication regimen with instructions to speak with his PCP.  3. HLD; LDL slightly elevated as is his total cholesterol increase Lipitor to 40 mg daily     Discharge Diagnoses:  Principal Problem:   Chest pain Active Problems:   HYPERLIPIDEMIA   HYPERTENSION   Right hand weakness   Discharge Condition: Stable  Diet recommendation: Healthy Filed Weights   01/29/13 1409 01/30/13 0500  Weight: 91.581 kg (201 lb 14.4 oz) 92 kg (202 lb 13.2 oz)    History of present illness:  Manuel [name] is a 72 y.o. 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 Per patient increasing episodes of SOB with exertion which started approximately 2 months ago, relieved with rest. Negative chest pain. Negative pedal edema Has never had stress test or cath. Lives at home with wife TODAY requests to return home   Hospital Course:  Admitted on 01/29/2013 with increasing episodes of shortness of breath on exertion. Negative cardiac enzymes, EKG was unremarkable patient was found to have LDL outside of the AHA guidelines and Lipitor was increased from 20 mg per day to 40 mg per day on discharge echocardiogram 01/30/2013 showed  Stress echocardiogram with no chest pain, no ST changes and no stress-induced wall motion abnormalities; normal stress echocardiogram.     Discharge Exam: Filed Vitals:   01/29/13 1409 01/29/13 2144 01/30/13 0500 01/30/13 1428  BP: 138/88 106/64  127/68   Pulse: 82 79  93  Temp: 97.6 F (36.4 C) 97.7 F (36.5 C)  97.3 F (36.3 C)  TempSrc: Oral Oral  Oral  Resp: 18 18  18   Height: 5\' 10"  (1.778 m)     Weight: 91.581 kg (201 lb 14.4 oz)  92 kg (202 lb 13.2 oz)   SpO2: 98% 97%  99%   General: Alert,NAD  Cardiovascular: Regular rhythm and rate, negative murmurs rubs or gallops, DP/PT pulses 2+ bilateral  Respiratory: Clear to auscultation bilateral  Abdomen: Soft nontender nondistended plus bowel sounds  Discharge Instructions     Medication List         ACCU-CHEK AVIVA PLUS test strip  Generic drug:  glucose blood  TEST EVERY DAY     aspirin 81 MG EC tablet  Take 81 mg by mouth daily.     atorvastatin 20 MG tablet  Commonly known as:  LIPITOR  Take 1 tablet (20 mg total) by mouth daily.     [employer]S LANCETS THIN 26G Misc  TEST EVERY DAY     DOVONEX 0.005 % cream  Generic drug:  calcipotriene  Apply 1 application topically See admin instructions. During week days     ketoconazole 2 % cream  Commonly known as:  NIZORAL  Apply 1 application topically See admin instructions. On face and ears, on week days     triamcinolone cream 0.1 %  Commonly known as:  KENALOG  Apply 1 application topically See admin instructions. Apply once a day on weekends     valsartan-hydrochlorothiazide 160-12.5 MG per tablet  Commonly  known as:  DIOVAN-HCT  Take 1 tablet by mouth daily.     Vitamin D 2000 UNITS Caps  Take 1 capsule by mouth daily.       Allergies  Allergen Reactions  . Ace Inhibitors Cough      The results of significant diagnostics from this hospitalization (including imaging, microbiology, ancillary and laboratory) are listed below for reference.    Significant Diagnostic Studies: Dg Chest 2 View  01/29/2013   *RADIOLOGY REPORT*  Clinical Data: 72 year old male chest pain.  CHEST - 2 VIEW  Comparison: 06/05/2009 and earlier.  Findings: Stable and normal lung volumes.  Cardiac size and mediastinal contours are  within normal limits.  Visualized tracheal air column is within normal limits.  No pneumothorax or pulmonary edema.  No pleural effusion or consolidation.  No acute or confluent pulmonary opacity. No acute osseous abnormality identified.  Calcified atherosclerosis of the abdominal aorta.  IMPRESSION: No acute cardiopulmonary abnormality.   Original Report Authenticated By: Erskine Speed, M.D.    Microbiology: No results found for this or any previous visit (from the past 240 hour(s)).   Labs: Basic Metabolic Panel:  Recent Labs Lab 01/29/13 1154 01/29/13 1732  NA 138 138  K 3.8 3.7  CL 100 100  CO2  --  28  GLUCOSE 115* 112*  BUN 16 13  CREATININE 0.90 0.78  CALCIUM  --  9.8  MG  --  2.4   Liver Function Tests:  Recent Labs Lab 01/29/13 1732  AST 25  ALT 27  ALKPHOS 89  BILITOT 1.0  PROT 8.1  ALBUMIN 4.3   No results found for this basename: LIPASE, AMYLASE,  in the last 168 hours No results found for this basename: AMMONIA,  in the last 168 hours CBC:  Recent Labs Lab 01/29/13 1142 01/29/13 1154 01/29/13 1732  WBC 6.2  --  7.9  NEUTROABS 3.9  --   --   HGB 13.9 13.9 14.6  HCT 39.6 41.0 42.6  MCV 88.8  --  89.9  PLT 215  --  247   Cardiac Enzymes:  Recent Labs Lab 01/29/13 1749 01/29/13 2236 01/30/13 0505  TROPONINI <0.30 <0.30 <0.30   BNP: BNP (last 3 results)  Recent Labs  01/29/13 1749  PROBNP 18.0   CBG: No results found for this basename: GLUCAP,  in the last 168 hours     Signed:  Carolyne Littles, J  [facility] 01/30/2013, 6:37 PM

## 2013-01-30 NOTE — Telephone Encounter (Signed)
Spoke with wife and advised per Dr. Lenord Fellers.  Wife states that he has had the stress test.  And, everytime someone comes in and offers him something to eat, he declines and says, I'm going home and they agree with him.  So, they just didn't understand why he was still there.  Manuel [name] that the patient always has the opportunity to use the call bell and ask the nurse any questions that he may have.  Wife verbalizes understanding.

## 2013-01-30 NOTE — Progress Notes (Signed)
  Echocardiogram 2D Echocardiogram has been performed.  Margreta Journey 01/30/2013, 11:08 AM

## 2013-01-30 NOTE — Progress Notes (Signed)
[facility] to do GXT echo today.

## 2013-01-30 NOTE — Progress Notes (Signed)
  Echocardiogram Echocardiogram Stress Test has been performed.  Manuel [name] 01/30/2013, 11:07 AM

## 2013-02-08 ENCOUNTER — Ambulatory Visit (INDEPENDENT_AMBULATORY_CARE_PROVIDER_SITE_OTHER): Payer: 59 | Admitting: Internal Medicine

## 2013-02-08 ENCOUNTER — Encounter: Payer: Self-pay | Admitting: Internal Medicine

## 2013-02-08 VITALS — BP 108/78 | HR 76 | Temp 97.3°F | Wt 204.0 lb

## 2013-02-08 DIAGNOSIS — K219 Gastro-esophageal reflux disease without esophagitis: Secondary | ICD-10-CM

## 2013-02-08 DIAGNOSIS — I10 Essential (primary) hypertension: Secondary | ICD-10-CM

## 2013-02-08 DIAGNOSIS — E119 Type 2 diabetes mellitus without complications: Secondary | ICD-10-CM

## 2013-02-08 DIAGNOSIS — E785 Hyperlipidemia, unspecified: Secondary | ICD-10-CM

## 2013-02-08 DIAGNOSIS — I499 Cardiac arrhythmia, unspecified: Secondary | ICD-10-CM

## 2013-02-09 NOTE — Progress Notes (Signed)
Patient informed. 

## 2013-02-10 NOTE — Patient Instructions (Addendum)
Return in approximate 6 weeks for lipid panel liver functions on increased dose of Lipitor 40 mg daily. 24 hour Holter monitor ordered.

## 2013-02-10 NOTE — Progress Notes (Signed)
  Subjective:    Patient ID: Manuel [name], male    DOB: 1941/07/04, 72 y.o.   MRN: [id number]  HPI  Patient was hospitalized recently briefly for chest discomfort. Says it's been present for some time but he noticed it while he was walking around [employer] on the day of admission. He just didn't feel quite right. He went to the emergency department after calling here for advice. He was admitted. MI was ruled out. He had a stress echocardiogram which was negative. 2-D echocardiogram was negative as well. Thinks he may have some GE reflux symptoms. Advise taking Nexium 10 mg daily. Pulse oximetry is normal on room air.  Apparently his Lipitor was increased to 40 mg daily. Hospital is felt LDL was not sufficiently control. He is on Diovan HCT for hypertension. History of type 2 diabetes mellitus controlled with diet.    Review of Systems     Objective:   Physical Exam Neck is supple without JVD thyromegaly or carotid bruits. Chest clear to auscultation. Cardiac exam: No murmur appreciated. No S3. Occasional extra systole. Extremities without edema.        Assessment & Plan:  Cardiac dysrhythmia-patient have 24-hour Holter monitor.  Recent admission for chest pain-MI ruled out and thought to have GE reflux. Treat with Nexium 10 mg daily  Hyperlipidemia-Lipitor increased in the hospital to 40 mg daily. Recheck in 6 weeks with lipid panel liver functions.  Diabetes mellitus-continue to treat with diet  25 minutes spent with patient

## 2013-02-12 ENCOUNTER — Encounter (INDEPENDENT_AMBULATORY_CARE_PROVIDER_SITE_OTHER): Payer: Medicare Other

## 2013-02-12 ENCOUNTER — Telehealth: Payer: Self-pay | Admitting: *Deleted

## 2013-02-12 DIAGNOSIS — R002 Palpitations: Secondary | ICD-10-CM

## 2013-02-12 DIAGNOSIS — I499 Cardiac arrhythmia, unspecified: Secondary | ICD-10-CM

## 2013-02-12 DIAGNOSIS — I4949 Other premature depolarization: Secondary | ICD-10-CM

## 2013-02-12 NOTE — Telephone Encounter (Signed)
24 hr holter monnitor placed on Pt 02/12/13 TK

## 2013-02-21 ENCOUNTER — Other Ambulatory Visit: Payer: Self-pay

## 2013-02-21 MED ORDER — GLUCOSE BLOOD VI STRP: 1.0000 | ORAL_STRIP | Freq: Every day | Status: DC

## 2013-02-26 ENCOUNTER — Other Ambulatory Visit: Payer: Self-pay

## 2013-02-26 MED ORDER — ATORVASTATIN CALCIUM 40 MG PO TABS: 40.0000 mg | ORAL_TABLET | Freq: Every day | ORAL | Status: DC

## 2013-04-17 ENCOUNTER — Other Ambulatory Visit: Payer: Self-pay

## 2013-04-17 MED ORDER — ATORVASTATIN CALCIUM 40 MG PO TABS: 40.0000 mg | ORAL_TABLET | Freq: Every day | ORAL | Status: DC

## 2013-05-08 ENCOUNTER — Ambulatory Visit (INDEPENDENT_AMBULATORY_CARE_PROVIDER_SITE_OTHER): Payer: 59 | Admitting: Internal Medicine

## 2013-05-08 ENCOUNTER — Encounter: Payer: Self-pay | Admitting: Internal Medicine

## 2013-05-08 VITALS — BP 124/82 | HR 88 | Temp 98.5°F | Wt 197.0 lb

## 2013-05-08 DIAGNOSIS — R7302 Impaired glucose tolerance (oral): Secondary | ICD-10-CM | POA: Insufficient documentation

## 2013-05-08 DIAGNOSIS — Z23 Encounter for immunization: Secondary | ICD-10-CM

## 2013-05-08 DIAGNOSIS — R7309 Other abnormal glucose: Secondary | ICD-10-CM

## 2013-05-08 DIAGNOSIS — K219 Gastro-esophageal reflux disease without esophagitis: Secondary | ICD-10-CM

## 2013-05-08 DIAGNOSIS — R109 Unspecified abdominal pain: Secondary | ICD-10-CM

## 2013-05-08 DIAGNOSIS — R1013 Epigastric pain: Secondary | ICD-10-CM

## 2013-05-08 DIAGNOSIS — E119 Type 2 diabetes mellitus without complications: Secondary | ICD-10-CM

## 2013-05-08 LAB — CBC WITH DIFFERENTIAL/PLATELET
Lymphocytes Relative: 20 % (ref 12–46)
Lymphs Abs: 1.5 10*3/uL (ref 0.7–4.0)
Neutro Abs: 5.1 10*3/uL (ref 1.7–7.7)
Neutrophils Relative %: 69 % (ref 43–77)
Platelets: 280 10*3/uL (ref 150–400)
RBC: 4.46 MIL/uL (ref 4.22–5.81)
WBC: 7.4 10*3/uL (ref 4.0–10.5)

## 2013-05-08 LAB — HEMOGLOBIN A1C: Hgb A1c MFr Bld: 6.1 % — ABNORMAL HIGH (ref <5.7)

## 2013-05-08 MED ORDER — ESOMEPRAZOLE MAGNESIUM 40 MG PO CPDR: 40.0000 mg | DELAYED_RELEASE_CAPSULE | Freq: Every day | ORAL | Status: DC

## 2013-05-08 NOTE — Patient Instructions (Addendum)
Take Nexium 40 mg daily. Have ultrasound of gallbladder and upper GI. Return in 3 Hemoccult cards. Flu vaccine given today. Return in 3 weeks.

## 2013-05-08 NOTE — Progress Notes (Signed)
  Subjective:    Patient ID: Manuel [name], male    DOB: 1941/03/11, 72 y.o.   MRN: [id number]  HPI Patient was hospitalized in July with chest pain. MI was ruled out. Subsequently he was seen at [facility] where he had a 24-hour Holter monitor showing frequent PVCs, occasional bigeminy, occasional 3 beat V. tach. Study was read by Dr. Patty Sermons. He had stress echocardiogram that was negative. He had a 2-D echocardiogram that was negative except for mild LVH. He thought he had some GE reflux symptoms. He took a 3 month treatment with over-the-counter Nexium. He would take it daily for 14 days and then stay off on it for 2 weeks and then restart it. He said these were the directions that were provided over-the-counter. Says he really seldom now eats late at night. His been trying to watch his glucose. More recently he's had some epigastric pain. He's had some mild right upper quadrant pain. Has been burping but has no water brash. This past summer, he had negative serology for H. pylori. Food is not getting stuck in his esophagus. Does drink coffee for breakfast and ate cereal. Says sometimes he will leak nuts and ice cream at night but not as much as he use to.  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  History of prediabetes. Says he's been a bit irregular recently but usually has daily bowel movements.    Review of Systems     Objective:   Physical Exam Neck is supple without JVD thyromegaly or carotid bruits. Chest clear to auscultation. Cardiac exam: regular rate and rhythm normal S1 and S2. Extremities without edema. Abdomen bowel sounds are active, no hepatosplenomegaly, masses, mildly tender in epigastrium without rebound. Slightly tender in right upper quadrant. Slightly tender in lower abdomen without rebound        Assessment & Plan:  Epigastric pain-consider peptic ulcer disease, GE reflux,  cholecystitis  Irregularity-could be due to decreased food intake recently with epigastric discomfort  Plan: He will have upper GI and ultrasound of the gallbladder. Given 3 Hemoccult cards to return to this office. Given influenza immunization today. CBC and comprehensive metabolic panel drawn.  Return in 3 weeks. Start Nexium 40 mg daily.

## 2013-05-09 LAB — COMPREHENSIVE METABOLIC PANEL
ALT: 23 U/L (ref 0–53)
CO2: 26 mEq/L (ref 19–32)
Calcium: 9.7 mg/dL (ref 8.4–10.5)
Chloride: 100 mEq/L (ref 96–112)
Potassium: 3.9 mEq/L (ref 3.5–5.3)
Sodium: 137 mEq/L (ref 135–145)
Total Bilirubin: 0.9 mg/dL (ref 0.3–1.2)
Total Protein: 7.1 g/dL (ref 6.0–8.3)

## 2013-05-21 ENCOUNTER — Ambulatory Visit
Admission: RE | Admit: 2013-05-21 | Discharge: 2013-05-21 | Disposition: A | Discharge status: Home / Self Care | Payer: Medicare Other | Source: Ambulatory Visit | Attending: Internal Medicine | Admitting: Internal Medicine

## 2013-05-21 DIAGNOSIS — R109 Unspecified abdominal pain: Secondary | ICD-10-CM

## 2013-05-22 ENCOUNTER — Telehealth: Payer: 59 | Admitting: *Deleted

## 2013-05-22 DIAGNOSIS — Z1211 Encounter for screening for malignant neoplasm of colon: Secondary | ICD-10-CM

## 2013-05-22 NOTE — Progress Notes (Signed)
Patient informed. Has appointment in November.

## 2013-05-23 ENCOUNTER — Telehealth: Payer: Self-pay | Admitting: *Deleted

## 2013-05-23 DIAGNOSIS — Z1211 Encounter for screening for malignant neoplasm of colon: Secondary | ICD-10-CM

## 2013-05-23 LAB — FECAL OCCULT BLOOD, GUAIAC
Card #2 Fecal Occult Blod, POC: NEGATIVE
Card #3 Fecal Occult Blood, POC: NEGATIVE

## 2013-05-23 NOTE — Telephone Encounter (Signed)
FOBT results entered

## 2013-05-31 ENCOUNTER — Encounter: Payer: Self-pay | Admitting: Internal Medicine

## 2013-05-31 ENCOUNTER — Ambulatory Visit (INDEPENDENT_AMBULATORY_CARE_PROVIDER_SITE_OTHER): Payer: 59 | Admitting: Internal Medicine

## 2013-05-31 VITALS — BP 134/84 | HR 76 | Temp 97.5°F | Ht 70.0 in | Wt 197.0 lb

## 2013-05-31 DIAGNOSIS — R220 Localized swelling, mass and lump, head: Secondary | ICD-10-CM

## 2013-05-31 DIAGNOSIS — R1013 Epigastric pain: Secondary | ICD-10-CM

## 2013-05-31 DIAGNOSIS — R609 Edema, unspecified: Secondary | ICD-10-CM

## 2013-05-31 DIAGNOSIS — I700 Atherosclerosis of aorta: Secondary | ICD-10-CM

## 2013-05-31 MED ORDER — CEPHALEXIN 500 MG PO CAPS: 500.0000 mg | ORAL_CAPSULE | Freq: Four times a day (QID) | ORAL | Status: DC

## 2013-05-31 NOTE — Patient Instructions (Addendum)
Take Keflex 500 mg 4 times a day for 7 days. Keep ice on right parotid gland 3-4 times daily. Continue Nexium. Call if epigastric pain does not improve in 3-4 weeks.

## 2013-06-01 DIAGNOSIS — I700 Atherosclerosis of aorta: Secondary | ICD-10-CM | POA: Insufficient documentation

## 2013-06-01 NOTE — Progress Notes (Signed)
  Subjective:    Patient ID: Manuel [name], male    DOB: Jul 23, 1941, 72 y.o.   MRN: [id number]  HPI  At last visit he was placed on Nexium 40 mg daily for epigastric pain. He had an upper GI which was normal. 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 Says that he still has some fullness in his epigastric area. However seems to be feeling a bit better. He had an ultrasound of his abdomen. No aortic abdominal aneurysm. No gallstones. Liver appeared to be normal. Trying to watch his diet a bit better.  Today he has a new problem. He has a prior history of parotiditis. Has had swelling right jaw onset yesterday. He's concerned because he wants to go out of town tomorrow to [place], [place].    Review of Systems     Objective:   Physical Exam  dentition is fair. No tender teeth identified. No lesions on buccal mucosa. Slight swelling right mandibular area. Abdomen is unremarkable.       Assessment & Plan:  Right parotiditis  Epigastric pain  Plan: Continue Nexium and if symptoms do not improve we will refer him to gastroenterologist. For parotiditis he will be treated with Keflex 500 mg 4 times daily for 10 days. May need to suck on lemon drops or a lemon.  25 minutes spent with patient

## 2013-06-05 ENCOUNTER — Ambulatory Visit: Payer: 59 | Admitting: Internal Medicine

## 2013-06-19 ENCOUNTER — Encounter: Payer: Self-pay | Admitting: Internal Medicine

## 2013-06-19 ENCOUNTER — Telehealth: Payer: Self-pay | Admitting: Internal Medicine

## 2013-06-19 ENCOUNTER — Encounter: Payer: Self-pay | Admitting: Gastroenterology

## 2013-06-19 DIAGNOSIS — R1011 Right upper quadrant pain: Secondary | ICD-10-CM

## 2013-06-19 NOTE — Telephone Encounter (Signed)
Error wrong md

## 2013-06-19 NOTE — Telephone Encounter (Signed)
Needs to have hepatobiliary scan to look at gallbladder function. Can you order by phone and I will put in?

## 2013-06-20 ENCOUNTER — Ambulatory Visit (INDEPENDENT_AMBULATORY_CARE_PROVIDER_SITE_OTHER): Payer: Medicare Other | Admitting: Nurse Practitioner

## 2013-06-20 ENCOUNTER — Encounter: Payer: Self-pay | Admitting: Nurse Practitioner

## 2013-06-20 ENCOUNTER — Telehealth: Payer: Self-pay | Admitting: Internal Medicine

## 2013-06-20 VITALS — BP 142/82 | HR 90 | Ht 70.5 in | Wt 196.0 lb

## 2013-06-20 DIAGNOSIS — R1011 Right upper quadrant pain: Secondary | ICD-10-CM

## 2013-06-20 NOTE — Progress Notes (Signed)
HPI :  Patient is a 72 year old male, new to this practice, referred for evaluation of upper abdominal discomfort. He had a normal screening colonoscopy by [facility] 4-5 years ago but otherwise has no GI history. Patient gives a two month history of vague epigastric / RUQ discomfort, belching and bloating. Occasionally discomfort feels like hunger pain though food doesn't aggravate or mitigate the pain. No nausea. He is trying to lose weight but no drastic losses. Patient has problems with right hand so he often uses right upper abdomen to stabilize things he is working on in the shop. Patient wonders therefore if some of his abdominal discomfort is musculoskeletal. Last month patient had an ultrasound and UGI series, both of which were normal. He is scheduled for HIDA scan next week. Patient recently started on daily Nexium before breakfast. Some days he feels better, other days still symptomatic. BMs are normal. CMET and CBC in October were normal. Patient's sister passed away two years ago with what sounds like a complication of biliary / gallbladder malignancy. Patient not overly concerned that something terrible is going on with himself but thought has entered his mind.   He has vague epigastric / RUQ discomfort.   He takes an Advil PM every night, a daily baby aspirin, in addition to Ibuprofen 1-2 times a week.   Past Medical History  Diagnosis Date  . Hypertension   . Hypercholesteremia   . Family history of anesthesia complication     MOTHER   . GERD (gastroesophageal reflux disease)   . Neuromuscular disorder     ETIOLOGY UNKNOWN   . Arthritis     Family History  Problem Relation Age of Onset  . Heart attack Father    History  Substance Use Topics  . Smoking status: Former Smoker -- 1.00 packs/day for 20 years    Types: Cigarettes    Quit date: 05/11/1982  . Smokeless tobacco: Never Used  . Alcohol Use: 0.6 oz/week    1 Glasses of wine per week     Comment: socially    Current Outpatient Prescriptions  Medication Sig Dispense Refill  . aspirin 81 MG EC tablet Take 81 mg by mouth daily.        Marland Kitchen atorvastatin (LIPITOR) 40 MG tablet Take 40 mg by mouth daily.      . calcipotriene (DOVONEX) 0.005 % cream Apply 1 application topically See admin instructions. During week days      . Cholecalciferol (VITAMIN D) 2000 UNITS CAPS Take 1 capsule by mouth daily.       . [facility] LANCETS THIN 26G MISC TEST EVERY DAY  100 each  6  . esomeprazole (NEXIUM) 40 MG capsule Take 1 capsule (40 mg total) by mouth daily.  30 capsule  5  . glucose blood (ACCU-CHEK AVIVA PLUS) test strip 1 each by Other route daily before breakfast. Use as instructed  100 each  3  . ketoconazole (NIZORAL) 2 % cream Apply 1 application topically See admin instructions. On face and ears, on week days      . triamcinolone cream (KENALOG) 0.1 % Apply 1 application topically See admin instructions. Apply once a day on weekends      . valsartan-hydrochlorothiazide (DIOVAN-HCT) 160-12.5 MG per tablet Take 1 tablet by mouth daily.       No current facility-administered medications for this visit.   Allergies  Allergen Reactions  . Ace Inhibitors Cough    Review of Systems: All systems reviewed  and negative except where noted in HPI.   Physical Exam: BP 142/82  Pulse 90  Ht 5' 10.5" (1.791 m)  Wt 196 lb (88.905 kg)  BMI 27.72 kg/m2 Constitutional: Pleasant,well-developed, white male in no acute distress. HEENT: Normocephalic and atraumatic. Conjunctivae are normal. No scleral icterus. Neck supple.  Cardiovascular: Normal rate, regular rhythm.  Pulmonary/chest: Effort normal and breath sounds normal. No wheezing, rales or rhonchi. Abdominal: Soft, nondistended, nontender. Bowel sounds active throughout. There are no masses palpable. No hepatomegaly. Extremities: no edema Lymphadenopathy: No cervical adenopathy noted. Neurological: Alert and oriented to person place and time. Skin: Skin is warm  and dry. No rashes noted. Psychiatric: Normal mood and affect. Behavior is normal.   ASSESSMENT AND PLAN:   72 year old male with vague RUQ/epigastric discomfort associated with belching and bloating. Labs, ultrasound and upper GI series negative. Patient looks okay, feels relatively well.  Patient has felt a little better over the last few days. Given negative workup thus far patient feels comfortable holding off on EGD for now. I do think it is reasonable to go with the HIDA scan given the associated bloating, belching. If HIDA is negative and symptoms persist will arrange for upper endoscopy . Patient will call us with a condition update in a couple of weeks.

## 2013-06-20 NOTE — Telephone Encounter (Signed)
Pt's wife called to report pt is having pain in the r rib area that radiates to his back. He had a full workup including [place] at Dr Beryle Quant ofc. There is an order for a HIDA Scan that has no appt. He was placed on Nexium and tx for GERD which was what his original appt was for. Pt will see Mike Gip, PA at 1:30pm.

## 2013-06-20 NOTE — Telephone Encounter (Signed)
Appointment 11/25; arrive 8:45 @ [facility], Entrance A.  [place].  Take elevators to Radiology.  DO NOT take ANY stomach meds the morning of procedure.  No further instructions provided to me.  Wife verbalizes understanding of these instructions.  She'll follow up as needed after today's Dr Rhea Belton appointment and the scan on 11/25.

## 2013-06-20 NOTE — Patient Instructions (Signed)
Continue Nexium once daily 20-30 minutes before breakfast. Proceed with HIDA scan as already scheduled. We will consider Upper Endoscopy if no improvement. Call in 2 weeks for a condition update. CC:  Mackie Pai MD

## 2013-06-21 NOTE — Progress Notes (Signed)
Reviewed and agree. ?functional dyspepsia?, may come to having EGD  For confirmation.

## 2013-06-25 ENCOUNTER — Telehealth: Payer: Self-pay | Admitting: Internal Medicine

## 2013-06-25 NOTE — Telephone Encounter (Signed)
Spoke with patient and discussed the OV note on 06/20/13. He cancelled the HIDA scan. He is feeling better now. He will call back in couple of weeks with update.

## 2013-06-26 ENCOUNTER — Ambulatory Visit (HOSPITAL_COMMUNITY): Payer: Medicare Other

## 2013-06-27 ENCOUNTER — Ambulatory Visit (HOSPITAL_COMMUNITY): Payer: Medicare Other

## 2013-07-11 ENCOUNTER — Telehealth: Payer: Self-pay | Admitting: Nurse Practitioner

## 2013-07-11 NOTE — Telephone Encounter (Signed)
Spoke with patient and scheduled OV with Dr. Rhea Belton on 08/01/13 at 11:30 AM. Patient wants a f/u OV since seeing Willette Cluster, NP.

## 2013-07-17 ENCOUNTER — Ambulatory Visit: Payer: Medicare Other | Admitting: Internal Medicine

## 2013-07-24 ENCOUNTER — Encounter: Payer: Self-pay | Admitting: Internal Medicine

## 2013-07-30 ENCOUNTER — Encounter (HOSPITAL_COMMUNITY): Payer: Self-pay | Admitting: Emergency Medicine

## 2013-07-30 ENCOUNTER — Telehealth: Payer: Self-pay | Admitting: Internal Medicine

## 2013-07-30 ENCOUNTER — Emergency Department (HOSPITAL_COMMUNITY)
Admission: EM | Admit: 2013-07-30 | Discharge: 2013-07-31 | Disposition: A | Discharge status: Home / Self Care | Payer: Medicare Other | Attending: Emergency Medicine | Admitting: Emergency Medicine

## 2013-07-30 DIAGNOSIS — R112 Nausea with vomiting, unspecified: Secondary | ICD-10-CM

## 2013-07-30 DIAGNOSIS — Z8489 Family history of other specified conditions: Secondary | ICD-10-CM | POA: Insufficient documentation

## 2013-07-30 DIAGNOSIS — R000 Tachycardia, unspecified: Secondary | ICD-10-CM | POA: Insufficient documentation

## 2013-07-30 DIAGNOSIS — G709 Myoneural disorder, unspecified: Secondary | ICD-10-CM | POA: Insufficient documentation

## 2013-07-30 DIAGNOSIS — K219 Gastro-esophageal reflux disease without esophagitis: Secondary | ICD-10-CM | POA: Insufficient documentation

## 2013-07-30 DIAGNOSIS — Z7982 Long term (current) use of aspirin: Secondary | ICD-10-CM | POA: Insufficient documentation

## 2013-07-30 DIAGNOSIS — E7800 Pure hypercholesterolemia, unspecified: Secondary | ICD-10-CM | POA: Insufficient documentation

## 2013-07-30 DIAGNOSIS — R1013 Epigastric pain: Secondary | ICD-10-CM

## 2013-07-30 DIAGNOSIS — Z888 Allergy status to other drugs, medicaments and biological substances status: Secondary | ICD-10-CM | POA: Insufficient documentation

## 2013-07-30 DIAGNOSIS — Z79899 Other long term (current) drug therapy: Secondary | ICD-10-CM | POA: Insufficient documentation

## 2013-07-30 DIAGNOSIS — Z87891 Personal history of nicotine dependence: Secondary | ICD-10-CM | POA: Insufficient documentation

## 2013-07-30 DIAGNOSIS — M129 Arthropathy, unspecified: Secondary | ICD-10-CM | POA: Insufficient documentation

## 2013-07-30 DIAGNOSIS — I10 Essential (primary) hypertension: Secondary | ICD-10-CM | POA: Insufficient documentation

## 2013-07-30 LAB — CBC WITH DIFFERENTIAL/PLATELET
Basophils Absolute: 0 10*3/uL (ref 0.0–0.1)
Basophils Relative: 0 % (ref 0–1)
Eosinophils Relative: 0 % (ref 0–5)
HCT: 43.2 % (ref 39.0–52.0)
Lymphocytes Relative: 7 % — ABNORMAL LOW (ref 12–46)
Lymphs Abs: 0.6 10*3/uL — ABNORMAL LOW (ref 0.7–4.0)
MCH: 32.1 pg (ref 26.0–34.0)
MCHC: 34.7 g/dL (ref 30.0–36.0)
MCV: 92.3 fL (ref 78.0–100.0)
Monocytes Absolute: 0.9 10*3/uL (ref 0.1–1.0)
Platelets: 246 10*3/uL (ref 150–400)
RDW: 12.8 % (ref 11.5–15.5)
WBC: 8.9 10*3/uL (ref 4.0–10.5)

## 2013-07-30 LAB — COMPREHENSIVE METABOLIC PANEL
ALT: 24 U/L (ref 0–53)
AST: 24 U/L (ref 0–37)
Albumin: 4.1 g/dL (ref 3.5–5.2)
CO2: 28 mEq/L (ref 19–32)
Calcium: 9 mg/dL (ref 8.4–10.5)
Creatinine, Ser: 0.82 mg/dL (ref 0.50–1.35)
GFR calc non Af Amer: 86 mL/min — ABNORMAL LOW (ref >90)
Glucose, Bld: 128 mg/dL — ABNORMAL HIGH (ref 70–99)
Sodium: 139 mEq/L (ref 135–145)
Total Protein: 7.9 g/dL (ref 6.0–8.3)

## 2013-07-30 NOTE — Telephone Encounter (Signed)
Patient's wife called saying he had had recurrence of abdominal pain and vomiting today. She says he has an appointment see Dr. Rhea Belton on Wednesday, December 31. She wants to know if he can wait for him to be seen then. I have advised he be taken to the emergency department for further evaluation. Previously, he canceled his hepatobiliary scan saying he was feeling better.

## 2013-07-30 NOTE — ED Notes (Addendum)
Pt has been seeing GI doctor for GI troubles.  Pt has an appt 12/31.  Yesterday pt began vomiting after lunch.  Pt states he has been having a "full" feeling in his stomach.  Pt seems to vomit after he eats.  Pt's wife states pt is "belching" all the time.

## 2013-07-31 ENCOUNTER — Encounter (HOSPITAL_COMMUNITY): Payer: Self-pay | Admitting: Emergency Medicine

## 2013-07-31 LAB — URINALYSIS, ROUTINE W REFLEX MICROSCOPIC
Glucose, UA: NEGATIVE mg/dL
Hgb urine dipstick: NEGATIVE
Protein, ur: NEGATIVE mg/dL
Specific Gravity, Urine: 1.02 (ref 1.005–1.030)
pH: 6.5 (ref 5.0–8.0)

## 2013-07-31 LAB — URINE MICROSCOPIC-ADD ON

## 2013-07-31 MED ORDER — ONDANSETRON HCL 4 MG PO TABS: 4.0000 mg | ORAL_TABLET | Freq: Four times a day (QID) | ORAL | Status: DC | PRN

## 2013-07-31 NOTE — ED Provider Notes (Signed)
CSN: [id number]     Arrival date & time 07/30/13  1750 History   First MD Initiated Contact with Patient 07/31/13 0059     Chief Complaint  Patient presents with  . Abdominal Pain  . Emesis   (Consider location/radiation/quality/duration/timing/severity/associated sxs/prior Treatment) Patient is a 72 y.o. male presenting with abdominal pain and vomiting. The history is provided by the patient.  Abdominal Pain Associated symptoms: vomiting   Emesis Associated symptoms: abdominal pain   He has been evaluated for postprandial epigastric pain, nausea, vomiting and had had a negative ultrasound. He had been seeing a gastroenterologist who started him on Nexium which has been taking. He had been going reasonably well until yesterday and today when he had postprandial epigastric pain and nausea and vomiting. He feels fine now. He is scheduled to see his gastroenterologist on December 30. He called his PCP who recommended that he come to the ED for evaluation.  Past Medical History  Diagnosis Date  . Hypertension   . Hypercholesteremia   . Family history of anesthesia complication     MOTHER   . GERD (gastroesophageal reflux disease)   . Neuromuscular disorder     ETIOLOGY UNKNOWN   . Arthritis    Past Surgical History  Procedure Laterality Date  . Colonoscopy      3-4 years ago   Family History  Problem Relation Age of Onset  . Heart attack Father    History  Substance Use Topics  . Smoking status: Former Smoker -- 1.00 packs/day for 20 years    Types: Cigarettes    Quit date: 05/11/1982  . Smokeless tobacco: Never Used  . Alcohol Use: 0.6 oz/week    1 Glasses of wine per week     Comment: socially    Review of Systems  Gastrointestinal: Positive for vomiting and abdominal pain.  All other systems reviewed and are negative.    Allergies  Ace inhibitors  Home Medications   Current Outpatient Rx  Name  Route  Sig  Dispense  Refill  . aspirin 81 MG EC tablet    Oral   Take 81 mg by mouth daily.           Marland Kitchen atorvastatin (LIPITOR) 40 MG tablet   Oral   Take 40 mg by mouth daily.         . calcipotriene (DOVONEX) 0.005 % cream   Topical   Apply 1 application topically See admin instructions. During week days         . Cholecalciferol (VITAMIN D) 2000 UNITS CAPS   Oral   Take 1 capsule by mouth daily.          Marland Kitchen esomeprazole (NEXIUM) 40 MG capsule   Oral   Take 1 capsule (40 mg total) by mouth daily.   30 capsule   5   . ketoconazole (NIZORAL) 2 % cream   Topical   Apply 1 application topically See admin instructions. On face and ears, on week days         . triamcinolone cream (KENALOG) 0.1 %   Topical   Apply 1 application topically See admin instructions. Apply once a day on weekends         . valsartan-hydrochlorothiazide (DIOVAN-HCT) 160-12.5 MG per tablet   Oral   Take 1 tablet by mouth daily.         . [facility]S LANCETS THIN 26G MISC      TEST EVERY DAY   100 each  6   . glucose blood (ACCU-CHEK AVIVA PLUS) test strip   Other   1 each by Other route daily before breakfast. Use as instructed   100 each   3    BP 135/86  Pulse 94  Temp(Src) 98.7 F (37.1 C) (Oral)  Resp 18  SpO2 89% Physical Exam  Nursing note and vitals reviewed.  72 year old male, resting comfortably and in no acute distress. Vital signs are significant for borderline tachycardia with heart rate 103. Oxygen saturation is 100%, which is normal. Head is normocephalic and atraumatic. PERRLA, EOMI. Oropharynx is clear. Neck is nontender and supple without adenopathy or JVD. Back is nontender and there is no CVA tenderness. Lungs are clear without rales, wheezes, or rhonchi. Chest is nontender. Heart has regular rate and rhythm without murmur. Abdomen is soft, flat, nontender without masses or hepatosplenomegaly and peristalsis is normoactive. Extremities have no cyanosis or edema, full range of motion is present. Skin is warm and dry  without rash. Neurologic: Mental status is normal, cranial nerves are intact, there are no motor or sensory deficits.   ED Course  Procedures (including critical care time) Labs Review Results for orders placed during the hospital encounter of 07/30/13  CBC WITH DIFFERENTIAL      Result Value Range   WBC 8.9  4.0 - 10.5 K/uL   RBC 4.68  4.22 - 5.81 MIL/uL   Hemoglobin 15.0  13.0 - 17.0 g/dL   HCT 16.1  09.6 - 04.5 %   MCV 92.3  78.0 - 100.0 fL   MCH 32.1  26.0 - 34.0 pg   MCHC 34.7  30.0 - 36.0 g/dL   RDW 40.9  81.1 - 91.4 %   Platelets 246  150 - 400 K/uL   Neutrophils Relative % 83 (*) 43 - 77 %   Neutro Abs 7.4  1.7 - 7.7 K/uL   Lymphocytes Relative 7 (*) 12 - 46 %   Lymphs Abs 0.6 (*) 0.7 - 4.0 K/uL   Monocytes Relative 10  3 - 12 %   Monocytes Absolute 0.9  0.1 - 1.0 K/uL   Eosinophils Relative 0  0 - 5 %   Eosinophils Absolute 0.0  0.0 - 0.7 K/uL   Basophils Relative 0  0 - 1 %   Basophils Absolute 0.0  0.0 - 0.1 K/uL  COMPREHENSIVE METABOLIC PANEL      Result Value Range   Sodium 139  135 - 145 mEq/L   Potassium 3.3 (*) 3.5 - 5.1 mEq/L   Chloride 99  96 - 112 mEq/L   CO2 28  19 - 32 mEq/L   Glucose, Bld 128 (*) 70 - 99 mg/dL   BUN 25 (*) 6 - 23 mg/dL   Creatinine, Ser 7.82  0.50 - 1.35 mg/dL   Calcium 9.0  8.4 - 95.6 mg/dL   Total Protein 7.9  6.0 - 8.3 g/dL   Albumin 4.1  3.5 - 5.2 g/dL   AST 24  0 - 37 U/L   ALT 24  0 - 53 U/L   Alkaline Phosphatase 82  39 - 117 U/L   Total Bilirubin 1.3 (*) 0.3 - 1.2 mg/dL   GFR calc non Af Amer 86 (*) >90 mL/min   GFR calc Af Amer >90  >90 mL/min  LIPASE, BLOOD      Result Value Range   Lipase 16  11 - 59 U/L  URINALYSIS, ROUTINE W REFLEX MICROSCOPIC  Result Value Range   Color, Urine YELLOW  YELLOW   APPearance CLEAR  CLEAR   Specific Gravity, Urine 1.020  1.005 - 1.030   pH 6.5  5.0 - 8.0   Glucose, UA NEGATIVE  NEGATIVE mg/dL   Hgb urine dipstick NEGATIVE  NEGATIVE   Bilirubin Urine NEGATIVE  NEGATIVE    Ketones, ur NEGATIVE  NEGATIVE mg/dL   Protein, ur NEGATIVE  NEGATIVE mg/dL   Urobilinogen, UA 1.0  0.0 - 1.0 mg/dL   Nitrite NEGATIVE  NEGATIVE   Leukocytes, UA SMALL (*) NEGATIVE  URINE MICROSCOPIC-ADD ON      Result Value Range   Squamous Epithelial / LPF RARE  RARE   WBC, UA 0-2  <3 WBC/hpf   RBC / HPF 0-2  <3 RBC/hpf   Bacteria, UA RARE  RARE   MDM   1. Nausea & vomiting   2. Epigastric pain    Epigastric discomfort and nausea which are only present postprandially. This is certainly suspicious for biliary tract disease. Old record reviewed and he did indeed have a negative ultrasound several months ago and had been scheduled for a HIDA scan which was canceled because he was clinically improving. At this point, I feel he should have the HIDA scan. He is to keep his appointment with his gastroenterologist who sent home with prescription for ondansetron.    Dione Booze, MD 07/31/13 [id number]

## 2013-08-01 ENCOUNTER — Ambulatory Visit (INDEPENDENT_AMBULATORY_CARE_PROVIDER_SITE_OTHER): Payer: Medicare Other | Admitting: Internal Medicine

## 2013-08-01 ENCOUNTER — Encounter: Payer: Self-pay | Admitting: Internal Medicine

## 2013-08-01 VITALS — BP 120/70 | HR 104 | Ht 70.0 in | Wt 193.2 lb

## 2013-08-01 DIAGNOSIS — R1011 Right upper quadrant pain: Secondary | ICD-10-CM

## 2013-08-01 DIAGNOSIS — R112 Nausea with vomiting, unspecified: Secondary | ICD-10-CM

## 2013-08-01 DIAGNOSIS — R1013 Epigastric pain: Secondary | ICD-10-CM

## 2013-08-01 MED ORDER — ESOMEPRAZOLE MAGNESIUM 40 MG PO CPDR: 40.0000 mg | DELAYED_RELEASE_CAPSULE | Freq: Every day | ORAL | Status: DC

## 2013-08-01 MED ORDER — ONDANSETRON 4 MG PO TBDP: 4.0000 mg | ORAL_TABLET | Freq: Three times a day (TID) | ORAL | Status: DC | PRN

## 2013-08-01 NOTE — Progress Notes (Signed)
Patient ID: Manuel [name], male   DOB: 08-05-1940, 72 y.o.   MRN: [id number] HPI: Mr. Diel is a 72 year old male with a past medical history of hypertension, hypercholesterolemia, GERD, and nonspecific neuromuscular disorder who is seen in followup to evaluate upper abdominal discomfort, nausea with vomiting. He is here alone today. He reports over the last 4-6 months having developed upper abdominal discomfort which has been somewhat intermittent. At times he has a hard time describing his symptoms. He was started on Nexium approximately 3 months ago and for a while he thought things were doing better. He has continued to occasionally have water brash with a bitter taste in the back of his mouth. Over the weekend he developed nausea with vomiting occurring once on Sunday, and 2 times on Monday. He went to the ER for evaluation on advice from his PCP. He was discharged with a prescription for Zofran and recommended to have a HIDA scan. HIDA scan had previously been ordered but not performed. He reports when he eats he has an upper and right upper quadrant abdominal discomfort. He feels this is definitely worsened by eating. It is not exertional.  He feels an upper abdominal fullness after eating. He has lost about 20 pounds over the last year which she felt is intentional. He reports he's tried to cut out sweets and eat smaller portions. He has noted frequent belching. He has used Advil PM to help with sleep.  He reports his sister had a history of gallbladder cancer but was also an alcoholic  Past Medical History  Diagnosis Date  . Hypertension   . Hypercholesteremia   . Family history of anesthesia complication     MOTHER   . GERD (gastroesophageal reflux disease)   . Neuromuscular disorder     ETIOLOGY UNKNOWN   . Arthritis     Past Surgical History  Procedure Laterality Date  . Colonoscopy      3-4 years ago    Current Outpatient Prescriptions  Medication Sig Dispense Refill  . aspirin  81 MG EC tablet Take 81 mg by mouth daily.        Marland Kitchen atorvastatin (LIPITOR) 40 MG tablet Take 40 mg by mouth daily.      . calcipotriene (DOVONEX) 0.005 % cream Apply 1 application topically See admin instructions. During week days      . Cholecalciferol (VITAMIN D) 2000 UNITS CAPS Take 1 capsule by mouth daily.       . [facility]S LANCETS THIN 26G MISC TEST EVERY DAY  100 each  6  . esomeprazole (NEXIUM) 40 MG capsule Take 1 capsule (40 mg total) by mouth daily.  30 capsule  5  . glucose blood (ACCU-CHEK AVIVA PLUS) test strip 1 each by Other route daily before breakfast. Use as instructed  100 each  3  . ketoconazole (NIZORAL) 2 % cream Apply 1 application topically See admin instructions. On face and ears, on week days      . triamcinolone cream (KENALOG) 0.1 % Apply 1 application topically See admin instructions. Apply once a day on weekends      . valsartan-hydrochlorothiazide (DIOVAN-HCT) 160-12.5 MG per tablet Take 1 tablet by mouth daily.      . ondansetron (ZOFRAN ODT) 4 MG disintegrating tablet Take 1 tablet (4 mg total) by mouth every 8 (eight) hours as needed for nausea or vomiting.  20 tablet  0   No current facility-administered medications for this visit.    Allergies  Allergen Reactions  .  Ace Inhibitors Cough    Family History  Problem Relation Age of Onset  . Heart attack Father     History  Substance Use Topics  . Smoking status: Former Smoker -- 1.00 packs/day for 20 years    Types: Cigarettes    Quit date: 05/11/1982  . Smokeless tobacco: Never Used  . Alcohol Use: 0.6 oz/week    1 Glasses of wine per week     Comment: socially    ROS: As per history of present illness, otherwise negative  BP 120/70  Pulse 104  Ht 5\' 10"  (1.778 m)  Wt 193 lb 4 oz (87.658 kg)  BMI 27.73 kg/m2 Constitutional: Well-developed and well-nourished. No distress. HEENT: Normocephalic and atraumatic. Oropharynx is clear and moist. No oropharyngeal exudate. Conjunctivae are normal.  No  scleral icterus. Neck: Neck supple. Trachea midline. Cardiovascular: Normal rate, regular rhythm and intact distal pulses. No M/R/G Pulmonary/chest: Effort normal and breath sounds normal. No wheezing, rales or rhonchi. Abdominal: Soft, right upper quadrant abdominal discomfort with deep palpation without rebound or guarding, nondistended. Bowel sounds active throughout. There are no masses palpable. No hepatosplenomegaly. Extremities: no clubbing, cyanosis, or edema Lymphadenopathy: No cervical adenopathy noted. Neurological: Alert and oriented to person place and time. Chronic inability to flex right hand Skin: Skin is warm and dry. No rashes noted. Psychiatric: Normal mood and affect. Behavior is normal.  RELEVANT LABS AND IMAGING: CBC    Component Value Date/Time   WBC 8.9 07/30/2013 1832   RBC 4.68 07/30/2013 1832   HGB 15.0 07/30/2013 1832   HCT 43.2 07/30/2013 1832   PLT 246 07/30/2013 1832   MCV 92.3 07/30/2013 1832   MCH 32.1 07/30/2013 1832   MCHC 34.7 07/30/2013 1832   RDW 12.8 07/30/2013 1832   LYMPHSABS 0.6* 07/30/2013 1832   MONOABS 0.9 07/30/2013 1832   EOSABS 0.0 07/30/2013 1832   BASOSABS 0.0 07/30/2013 1832    CMP     Component Value Date/Time   NA 139 07/30/2013 1832   K 3.3* 07/30/2013 1832   CL 99 07/30/2013 1832   CO2 28 07/30/2013 1832   GLUCOSE 128* 07/30/2013 1832   BUN 25* 07/30/2013 1832   CREATININE 0.82 07/30/2013 1832   CREATININE 0.84 05/08/2013 1044   CALCIUM 9.0 07/30/2013 1832   PROT 7.9 07/30/2013 1832   ALBUMIN 4.1 07/30/2013 1832   AST 24 07/30/2013 1832   ALT 24 07/30/2013 1832   ALKPHOS 82 07/30/2013 1832   BILITOT 1.3* 07/30/2013 1832   GFRNONAA 86* 07/30/2013 1832   GFRAA >90 07/30/2013 1832   H. pylori antibody -- negative FOBT -- negative  Imaging from October 2014  ULTRASOUND ABDOMEN COMPLETE   COMPARISON:  None.   FINDINGS: Gallbladder   No gallstones or wall thickening visualized. No sonographic Murphy sign  noted.   Common bile duct   Diameter: Normal caliber of 3 mm.   Liver   No focal lesion identified. Within normal limits in parenchymal echogenicity.   IVC   No abnormality visualized.   Pancreas   Visualized portion unremarkable.   Spleen   Size and appearance within normal limits.   Right Kidney   Length: 10.9 cm. Echogenicity within normal limits. No mass or hydronephrosis visualized.   Left Kidney   Length: 13.1 cm. Echogenicity within normal limits. No mass or hydronephrosis visualized. 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   Abdominal aorta   No aneurysm visualized.   IMPRESSION: Normal abdominal ultrasound. CLINICAL  DATA:  Abdominal pain.   EXAM: UPPER GI SERIES W/HIGH DENSITY W/KUB   TECHNIQUE: After obtaining a scout radiograph a routine upper GI series was performed using thin and high density barium.   COMPARISON:  Ultrasound 05/21/2013   FLUOROSCOPY TIME:  One. 54 seconds.   FINDINGS: Esophageal and gastric mucosal pattern and peristaltic activity are normal. Gastric antrum distends normally. The duodenum and C-loop are normal. Degenerative changes noted of the lumbar spine and both hips. Vascular calcifications are noted in the pelvis consistent with phleboliths. Aortoiliac atherosclerotic vascular disease present.   IMPRESSION: Normal upper GI.  ASSESSMENT/PLAN:  72 year old male with a past medical history of hypertension, hypercholesterolemia, GERD, and nonspecific neuromuscular disorder who is seen in followup to evaluate upper abdominal discomfort, nausea with vomiting.   1.  Upper/right upper abd pain/N/V -- symptoms remain somewhat vague though definitely localized to the upper abdomen. Previous lab testing and imaging have been unremarkable, though he has continued to have trouble. I recommended upper endoscopy for direct visualization. We discussed the test today including the risks and  benefits and he is agreeable to proceed. Also agree with HIDA scan to rule out gallbladder dysfunction.  We have discussed how chronic cholecystitis is possible without the presence of gallstones, and HIDA scan would help to identify these patients.  For now he will continue the Nexium 40 mg daily and Zofran on an as-needed basis for nausea. I will give him the oral disintegrating version of this medicine.  Advised that it a low fat diet while this workup is ongoing. If EGD and HIDA are unremarkable, my next recommendation would be abdominal CT scan with contrast. He understands this recommendation.  2.  CRC screening -- up to date, screening colonoscopy performed 01/16/2009 by Dr. Bosie Clos - small internal hemorrhoids, otherwise normal. Recommended repeat June 2020  Further recommendations after testing.

## 2013-08-01 NOTE — Patient Instructions (Signed)
You have been scheduled for an endoscopy with propofol. Please follow written instructions given to you at your visit today. If you use inhalers (even only as needed), please bring them with you on the day of your procedure. Your physician has requested that you go to www.startemmi.com and enter the access code given to you at your visit today. This web site gives a general overview about your procedure. However, you should still follow specific instructions given to you by our office regarding your preparation for the procedure.  We have sent the following medications to your pharmacy for you to pick up at your convenience: nexium, Zofran  You have been scheduled for a HIDA scan at 1:00pm (1st floor) on 1:15pm. Please arrive 15 minutes prior to your scheduled appointment on 45:40JW. Make certain not to have anything to eat or drink at least 6 hours prior to your test. Should this appointment date or time not work well for you, please call radiology scheduling at [phone].  _____________________________________________________________________ hepatobiliary (HIDA) scan is an imaging procedure used to diagnose problems in the liver, gallbladder and bile ducts. In the HIDA scan, a radioactive chemical or tracer is injected into a vein in your arm. The tracer is handled by the liver like bile. Bile is a fluid produced and excreted by your liver that helps your digestive system break down fats in the foods you eat. Bile is stored in your gallbladder and the gallbladder releases the bile when you eat a meal. A special nuclear medicine scanner (gamma camera) tracks the flow of the tracer from your liver into your gallbladder and small intestine.  During your HIDA scan  You'll be asked to change into a hospital gown before your HIDA scan begins. Your health care team will position you on a table, usually on your back. The radioactive tracer is then injected into a vein in your arm.The tracer travels through  your bloodstream to your liver, where it's taken up by the bile-producing cells. The radioactive tracer travels with the bile from your liver into your gallbladder and through your bile ducts to your small intestine.You may feel some pressure while the radioactive tracer is injected into your vein. As you lie on the table, a special gamma camera is positioned over your abdomen taking pictures of the tracer as it moves through your body. The gamma camera takes pictures continually for about an hour. You'll need to keep still during the HIDA scan. This can become uncomfortable, but you may find that you can lessen the discomfort by taking deep breaths and thinking about other things. Tell your health care team if you're uncomfortable. The radiologist will watch on a computer the progress of the radioactive tracer through your body. The HIDA scan may be stopped when the radioactive tracer is seen in the gallbladder and enters your small intestine. This typically takes about an hour. In some cases extra imaging will be performed if original images aren't satisfactory, if morphine is given to help visualize the gallbladder or if the medication CCK is given to look at the contraction of the gallbladder. This test typically takes 2 hours to complete. ________________________________________________________________________

## 2013-08-06 ENCOUNTER — Encounter: Payer: Self-pay | Admitting: Internal Medicine

## 2013-08-06 ENCOUNTER — Ambulatory Visit (AMBULATORY_SURGERY_CENTER): Payer: Medicare Other | Admitting: Internal Medicine

## 2013-08-06 VITALS — BP 116/71 | HR 78 | Temp 97.8°F | Resp 21 | Ht 70.0 in | Wt 193.0 lb

## 2013-08-06 DIAGNOSIS — R1011 Right upper quadrant pain: Secondary | ICD-10-CM

## 2013-08-06 DIAGNOSIS — K2970 Gastritis, unspecified, without bleeding: Secondary | ICD-10-CM

## 2013-08-06 DIAGNOSIS — R112 Nausea with vomiting, unspecified: Secondary | ICD-10-CM

## 2013-08-06 DIAGNOSIS — R1013 Epigastric pain: Secondary | ICD-10-CM

## 2013-08-06 DIAGNOSIS — K219 Gastro-esophageal reflux disease without esophagitis: Secondary | ICD-10-CM

## 2013-08-06 DIAGNOSIS — K2990 Gastroduodenitis, unspecified, without bleeding: Secondary | ICD-10-CM

## 2013-08-06 MED ORDER — SODIUM CHLORIDE 0.9 % IV SOLN: 500.0000 mL | INTRAVENOUS | Status: DC

## 2013-08-06 MED ORDER — ESOMEPRAZOLE MAGNESIUM 40 MG PO CPDR: 40.0000 mg | DELAYED_RELEASE_CAPSULE | Freq: Two times a day (BID) | ORAL | Status: DC

## 2013-08-06 MED ORDER — SUCRALFATE 1 G PO TABS: 1.0000 g | ORAL_TABLET | Freq: Three times a day (TID) | ORAL | Status: DC

## 2013-08-06 NOTE — Progress Notes (Signed)
Called to room to assist during endoscopic procedure.  Patient ID and intended procedure confirmed with present staff. Received instructions for my participation in the procedure from the performing physician.  

## 2013-08-06 NOTE — Progress Notes (Signed)
Procedure ends, to recovery, report given and VSS. 

## 2013-08-06 NOTE — Patient Instructions (Signed)

## 2013-08-06 NOTE — Op Note (Signed)
[facility]  [address] [place], [id number]   ENDOSCOPY PROCEDURE REPORT  PATIENT: Manuel, [name]  MR#: [id number] BIRTHDATE: 03/05/1941 , 72  yrs. old GENDER: Male ENDOSCOPIST: Jerene Bears, MD PROCEDURE DATE:  08/06/2013 PROCEDURE:  EGD w/ biopsy ASA CLASS:     Class III INDICATIONS:  Epigastric pain.   Nausea.   Vomiting.   abdominal pain in the upper right quadrant. MEDICATIONS: MAC sedation, administered by CRNA and propofol (Diprivan) 200mg  IV TOPICAL ANESTHETIC: Cetacaine Spray  DESCRIPTION OF PROCEDURE: After the risks benefits and alternatives of the procedure were thoroughly explained, informed consent was obtained.  The LB [id number] endoscope was introduced through the mouth and advanced to the second portion of the duodenum. Without limitations.  The instrument was slowly withdrawn as the mucosa was fully examined.     ESOPHAGUS: A sessile papule measuring 4-5 mm in size was found in the lower third of the esophagus.  Biopsy which removed the papule entirely was performed using cold forceps.   An very slightly irregular Z-line was observed 39 cm from the incisors.  Multiple biopsies were performed using cold forceps.  Sample obtained to rule out Barrett's esophagus.  STOMACH: Ulcerated acute gastritis (inflammation) was found in the gastric body and gastric antrum.  There were erosions present. Multiple biopsies were performed using cold forceps.   Multiple small sessile polyps were found in the cardia, gastric fundus, and gastric body, likely fundic gland in nature.  Multiple biopsies was performed using cold forceps.  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   The duodenal mucosa showed no other abnormalities in the bulb and second portion of the duodenum.  Retroflexed views revealed no abnormalities.     The scope was then withdrawn from the patient and the procedure  completed.  COMPLICATIONS: There were no complications.  ENDOSCOPIC IMPRESSION: 1.   Sessile papule measuring 4-5 mm in size was found in the lower third of the esophagus; removed by biopsy 2.   Slightly irregular Z-line was observed 39 cm from the incisors; multiple biopsies 3.   Acute gastritis (inflammation) was found in the gastric body and gastric antrum; multiple biopsies 4.   Multiple small sessile polyps were found in the cardia, gastric fundus, and gastric body 5.   Small angiodysplastic lesion with no bleeding found in the 2nd part of the duodenum 6.   The duodenal mucosa showed no other abnormalities in the bulb and second portion of the duodenum  RECOMMENDATIONS: 1.  Await pathology results 2.  Increase Nexium to 40 mg twice daily 3.  Follow-up of helicobacter pylori status, treat if indicated 4.  Proceed to HIDA scan 5.  Carafate 1 g three times daily with meals and at bedtime  eSigned:  Jerene Bears, MD 08/06/2013 2:36 PM   CC:The Patient and Emeline General, MD  PATIENT NAME:  Manuel, [name] MR#: [id number]

## 2013-08-07 ENCOUNTER — Telehealth: Payer: Self-pay | Admitting: *Deleted

## 2013-08-07 NOTE — Telephone Encounter (Signed)
  Follow up Call-  Call back number 08/06/2013  Post procedure Call Back phone  # [phone]  Permission to leave phone message Yes     Patient questions:  Do you have a fever, pain , or abdominal swelling? no Pain Score  0 *  Have you tolerated food without any problems? yes  Have you been able to return to your normal activities? yes  Do you have any questions about your discharge instructions: Diet   no Medications  no Follow up visit  no  Do you have questions or concerns about your Care? no  Actions: * If pain score is 4 or above: No action needed, pain <4.

## 2013-08-13 ENCOUNTER — Telehealth: Payer: Self-pay | Admitting: Internal Medicine

## 2013-08-13 ENCOUNTER — Encounter: Payer: Self-pay | Admitting: Internal Medicine

## 2013-08-13 NOTE — Telephone Encounter (Signed)
Informed wife that the path is not back yet. Called pathology and they will check on it.

## 2013-08-13 NOTE — Telephone Encounter (Signed)
Dr Hilarie Fredrickson, path is back, can you review pt called this am? Thanks.

## 2013-08-13 NOTE — Telephone Encounter (Signed)
Pathology results show mild chronic inflammation in the stomach with ulceration and also evidence for reflux in the lower esophagus There was no evidence for precancerous cells or cancer No evidence of Barrett's esophagus All of which is good news. It is still possible that ulcers are responsible for his symptoms I would like him to continue the acid suppression medication as recommended He should proceed to HIDA scan And if symptoms fail to resolve promptly and if HIDA scan is negative, would proceed to CT of the abdomen and pelvis

## 2013-08-13 NOTE — Telephone Encounter (Signed)
Left message with wife, we will call back in am.

## 2013-08-14 NOTE — Telephone Encounter (Signed)
Informed pt of Dr Paula Libra findings and interpretations and he stated understanding. He is concerned about the HIDA SCAN. He read about using Morphine and then he is worried about lying still for 2 hours. Called [facility] and then called the pt back. [facility]uc [facility] uses Morphine if after 1 hour, they cannot see the GB. He will be in a hospital bed that can be adjusted for his comfort. Pt stated understanding,.

## 2013-08-16 ENCOUNTER — Encounter (HOSPITAL_COMMUNITY): Payer: Self-pay

## 2013-08-16 ENCOUNTER — Ambulatory Visit (HOSPITAL_COMMUNITY)
Admission: RE | Admit: 2013-08-16 | Discharge: 2013-08-16 | Disposition: A | Discharge status: Home / Self Care | Payer: Medicare Other | Source: Ambulatory Visit | Attending: Internal Medicine | Admitting: Internal Medicine

## 2013-08-16 DIAGNOSIS — R1013 Epigastric pain: Secondary | ICD-10-CM

## 2013-08-16 DIAGNOSIS — R112 Nausea with vomiting, unspecified: Secondary | ICD-10-CM

## 2013-08-16 DIAGNOSIS — R1011 Right upper quadrant pain: Secondary | ICD-10-CM | POA: Insufficient documentation

## 2013-08-16 MED ORDER — TECHNETIUM TC 99M MEBROFENIN IV KIT
5.5000 | PACK | Freq: Once | INTRAVENOUS | Status: AC | PRN
  Administered 2013-08-16: 5.5 via INTRAVENOUS

## 2013-08-21 ENCOUNTER — Telehealth: Payer: Self-pay | Admitting: *Deleted

## 2013-08-21 NOTE — Telephone Encounter (Signed)
Pt called back for me to repeat what I told him Friday when he was given the HIDA results. Reports he's had mainly good days but sometimes he feels tight in his r side under his ribs. I explained the CT scan and the prep; he will think it over and call back. If he decides, OK to order the CT scan? Thanks.

## 2013-08-21 NOTE — Telephone Encounter (Signed)
Yes okay for CT abdomen and pelvis with contrast And I agree with proceeding with this scan if symptoms don't improve completely

## 2013-08-22 NOTE — Telephone Encounter (Signed)
Noted. Informed pt Dr Hilarie Fredrickson agrees with proceeding with CT if his s&s don't improve completely; pt stated understanding.

## 2013-08-23 ENCOUNTER — Other Ambulatory Visit: Payer: Self-pay | Admitting: Internal Medicine

## 2013-08-23 ENCOUNTER — Telehealth: Payer: Self-pay | Admitting: Internal Medicine

## 2013-08-23 DIAGNOSIS — R1013 Epigastric pain: Secondary | ICD-10-CM

## 2013-08-23 DIAGNOSIS — R112 Nausea with vomiting, unspecified: Secondary | ICD-10-CM

## 2013-08-23 DIAGNOSIS — R1011 Right upper quadrant pain: Secondary | ICD-10-CM

## 2013-08-23 NOTE — Telephone Encounter (Signed)
Pt has decided he will have a CT. He is scheduled for 08/29/13 at 10:30am. He will come by and pick up instructions and his prep.

## 2013-08-27 ENCOUNTER — Telehealth: Payer: Self-pay | Admitting: Internal Medicine

## 2013-08-27 NOTE — Telephone Encounter (Signed)
Do not think back issues are caused by Lipitor. Sounds like back pain. He can stop the med for one month and see if there is a difference.

## 2013-08-28 ENCOUNTER — Telehealth: Payer: Self-pay | Admitting: Internal Medicine

## 2013-08-28 MED ORDER — CLONAZEPAM 0.25 MG PO TBDP
ORAL_TABLET | ORAL | Status: DC
Start: 2013-08-28 — End: 2013-11-29

## 2013-08-28 NOTE — Telephone Encounter (Signed)
lmom for pt to call back

## 2013-08-28 NOTE — Telephone Encounter (Signed)
Okay for clonazepam 0.25 mg every 8 hours as needed for anxiety Can use before CT scan #5

## 2013-08-28 NOTE — Telephone Encounter (Signed)
Pt sounded very anxious this am. He has a CT tomorrow. He had a visit for an insurance company physical yesterday and his BP was 139/90. After she left, he used his home machine and it read the same. He woke up this am and couldn't go back to sleep, took his BP 163/105. We discussed his Lipitor being increased and he has a call in to Dr Renold Genta to decrease it back to 20 mg/day. I asked if he would like something for anxiety for his CT tomorrow, but he has to have a driver and he stated yes. Can pt have a few pills for today and in am? Thanks.

## 2013-08-28 NOTE — Telephone Encounter (Signed)
Informed wife of med for anxiety.

## 2013-08-28 NOTE — Telephone Encounter (Signed)
Left message for patient with Dr. Verlene Mayer instruction to stop Lipitor 40 mg for one month and see if there is a difference in the symptoms that he feels are being caused due to the Lipitor.  Patient instructed to call us back in 1 month to advise how he's feeling at that point.

## 2013-08-29 ENCOUNTER — Ambulatory Visit
Admission: RE | Admit: 2013-08-29 | Discharge: 2013-08-29 | Disposition: A | Discharge status: Home / Self Care | Payer: Medicare Other | Source: Ambulatory Visit | Attending: Internal Medicine | Admitting: Internal Medicine

## 2013-08-29 DIAGNOSIS — R1011 Right upper quadrant pain: Secondary | ICD-10-CM

## 2013-08-29 DIAGNOSIS — R112 Nausea with vomiting, unspecified: Secondary | ICD-10-CM

## 2013-08-29 DIAGNOSIS — R1013 Epigastric pain: Secondary | ICD-10-CM

## 2013-08-31 ENCOUNTER — Ambulatory Visit (INDEPENDENT_AMBULATORY_CARE_PROVIDER_SITE_OTHER)
Admission: RE | Admit: 2013-08-31 | Discharge: 2013-08-31 | Disposition: A | Discharge status: Home / Self Care | Payer: Medicare Other | Source: Ambulatory Visit | Attending: Internal Medicine | Admitting: Internal Medicine

## 2013-08-31 ENCOUNTER — Telehealth: Payer: Self-pay | Admitting: *Deleted

## 2013-08-31 DIAGNOSIS — R1013 Epigastric pain: Secondary | ICD-10-CM

## 2013-08-31 DIAGNOSIS — R112 Nausea with vomiting, unspecified: Secondary | ICD-10-CM

## 2013-08-31 DIAGNOSIS — R1011 Right upper quadrant pain: Secondary | ICD-10-CM

## 2013-08-31 MED ORDER — IOHEXOL 300 MG/ML  SOLN
100.0000 mL | Freq: Once | INTRAMUSCULAR | Status: AC | PRN
  Administered 2013-08-31: 100 mL via INTRAVENOUS

## 2013-09-03 NOTE — Telephone Encounter (Signed)
   Notes Recorded by Jerene Bears, MD on 08/31/2013 at 4:54 PM CT abd shows normal liver, gallbladder, pancreas and kidneys. There is a left adrenal lesion which is felt to be a benign adrenal adenoma, occasionally these can produce hormones. I would have him see endocrinology in this regard -- but he should not overly worry about this, but discuss it with endocrine Prostate is enlarged, by CT which is not uncommon in men his age. He should ensure that he is up-to-date on prostate screening with Dr. Renold Genta (unless he is regularly seen by a urologist) Continue current meds, okay for OV followup if needed      Late entry 08/31/13 informed pt of results. He sees Dr Diona Fanti regularly and I will forward the report to him. Informed him he may f/u with [place] for any questions about this or future concerns or problems. It was 5pm when I called him on Friday, left a message for him to call me back about the referral to an endocrinologist.

## 2013-09-03 NOTE — Telephone Encounter (Signed)
Discussed CT results with pt again since we were so rushed. He will wait and discuss with Dr Hilarie Fredrickson on 09/10/13 visit.

## 2013-09-05 ENCOUNTER — Encounter: Payer: Self-pay | Admitting: Internal Medicine

## 2013-09-10 ENCOUNTER — Ambulatory Visit (INDEPENDENT_AMBULATORY_CARE_PROVIDER_SITE_OTHER): Payer: Medicare Other | Admitting: Internal Medicine

## 2013-09-10 ENCOUNTER — Encounter: Payer: Self-pay | Admitting: Internal Medicine

## 2013-09-10 VITALS — BP 122/84 | HR 92 | Ht 70.5 in | Wt 186.2 lb

## 2013-09-10 DIAGNOSIS — K2990 Gastroduodenitis, unspecified, without bleeding: Secondary | ICD-10-CM

## 2013-09-10 DIAGNOSIS — R1013 Epigastric pain: Secondary | ICD-10-CM

## 2013-09-10 DIAGNOSIS — K2970 Gastritis, unspecified, without bleeding: Secondary | ICD-10-CM

## 2013-09-10 DIAGNOSIS — G4700 Insomnia, unspecified: Secondary | ICD-10-CM

## 2013-09-10 DIAGNOSIS — D3500 Benign neoplasm of unspecified adrenal gland: Secondary | ICD-10-CM

## 2013-09-10 MED ORDER — ESOMEPRAZOLE MAGNESIUM 40 MG PO CPDR: 40.0000 mg | DELAYED_RELEASE_CAPSULE | Freq: Two times a day (BID) | ORAL | Status: DC

## 2013-09-10 NOTE — Progress Notes (Signed)
Subjective:    Patient ID: Manuel [name], male    DOB: May 17, 1941, 73 y.o.   MRN: [id number]  HPI Manuel [name] is a 73 year old male with a past medical history of hypertension, hypercholesterolemia, GERD, and nonspecific neuromuscular disorder who is seen in followup.  He is here today with his wife.  He was initially evaluated for upper abdominal pain with nausea and vomiting. He had an upper endoscopy on 08/06/2013 which revealed acute erosive gastritis in the gastric body and antrum. Irregular Z line. Small sessile proximal gastric polyps, and a small angiodysplastic lesion in the second portion of the duodenum. Biopsies were benign but did show mild chronic inflammation and surface ulceration. There was no evidence of H. pylori or metaplasia. The gastric polyps were benign. GE junction biopsy did not show Barrett's. He was started on twice daily PPI and Carafate. He then had a CT scan which was performed recently which showed a left adrenal adenoma, prostatic enlargement but was otherwise unremarkable. No acute findings. HIDA scan revealed gallbladder ejection fraction low end of normal.  He reports that he continues to have vague complaints which he has a hard time describing. He does report some instances of sour stomach but he has been off of his Nexium and Carafate since his CT scan. He still feels like "something is wrong". He's had increased anxiety over the last several weeks. He is sleeping poorly and finds himself waking up shortly after going to sleep and is unable to return to sleep. He was previously using Advil PM but stopped after his gastritis was diagnosed. He denies flushing. He has lost 7 additional pounds which is about 30 pounds overall for him. Appetite has been a bit better   Review of Systems As per history of present illness, otherwise negative  Current Medications, Allergies, Past Medical History, Past Surgical History, Family History and Social History were reviewed in  [employer] record.     Objective:   Physical Exam BP 122/84  Pulse 92  Ht 5' 10.5" (1.791 m)  Wt 186 lb 3.2 oz (84.46 kg)  BMI 26.33 kg/m2 Constitutional: Well-developed and well-nourished. No distress. HEENT: Normocephalic and atraumatic. Oropharynx is clear and moist. No oropharyngeal exudate. Conjunctivae are normal.  No scleral icterus. Neck: Neck supple. Trachea midline. Cardiovascular: Normal rate, regular rhythm and intact distal pulses.  Pulmonary/chest: Effort normal and breath sounds normal. No wheezing, rales or rhonchi. Abdominal: Soft, nontender, nondistended. Bowel sounds active throughout. There are no masses palpable. No hepatosplenomegaly. Extremities: no clubbing, cyanosis, or edema. Neurological: Alert and oriented to person place and time. Skin: Skin is warm and dry. No rashes noted. Psychiatric: Normal mood and affect. Behavior is normal.  CBC    Component Value Date/Time   WBC 8.9 07/30/2013 1832   RBC 4.68 07/30/2013 1832   HGB 15.0 07/30/2013 1832   HCT 43.2 07/30/2013 1832   PLT 246 07/30/2013 1832   MCV 92.3 07/30/2013 1832   MCH 32.1 07/30/2013 1832   MCHC 34.7 07/30/2013 1832   RDW 12.8 07/30/2013 1832   LYMPHSABS 0.6* 07/30/2013 1832   MONOABS 0.9 07/30/2013 1832   EOSABS 0.0 07/30/2013 1832   BASOSABS 0.0 07/30/2013 1832    CMP     Component Value Date/Time   NA 139 07/30/2013 1832   K 3.3* 07/30/2013 1832   CL 99 07/30/2013 1832   CO2 28 07/30/2013 1832   GLUCOSE 128* 07/30/2013 1832   BUN 25* 07/30/2013 1832   CREATININE  0.82 07/30/2013 1832   CREATININE 0.84 05/08/2013 1044   CALCIUM 9.0 07/30/2013 1832   PROT 7.9 07/30/2013 1832   ALBUMIN 4.1 07/30/2013 1832   AST 24 07/30/2013 1832   ALT 24 07/30/2013 1832   ALKPHOS 82 07/30/2013 1832   BILITOT 1.3* 07/30/2013 1832   GFRNONAA 86* 07/30/2013 1832   GFRAA >90 07/30/2013 1832    CT ABDOMEN AND PELVIS WITH CONTRAST   TECHNIQUE: Multidetector CT  imaging of the abdomen and pelvis was performed using the standard protocol following bolus administration of intravenous contrast.   CONTRAST:  122mL OMNIPAQUE IOHEXOL 300 MG/ML  SOLN   COMPARISON:  None.   FINDINGS: The liver, gallbladder, pancreas, spleen, and kidneys are normal in appearance. No evidence hydronephrosis.   A left adrenal mass is seen which measures 2.6 x 3.0 cm. This shows relative contrast washout consistent with a benign adrenal adenoma. The right adrenal gland is normal in appearance.   Moderately enlarged prostate gland is seen with mild mass effect on the bladder base. Bladder is nondilated otherwise unremarkable in appearance. No other soft tissue masses or lymphadenopathy identified within the abdomen or pelvis.   No evidence of inflammatory process or abnormal fluid collections. No evidence of bowel wall thickening, dilatation, or hernia.   IMPRESSION: No acute findings.   Moderately enlarged prostate.   Benign left adrenal adenoma.  ________________________________________________________________________________________ NUCLEAR MEDICINE HEPATOBILIARY IMAGING WITH GALLBLADDER EF   TECHNIQUE: Sequential images of the abdomen were obtained out to 60 minutes following intravenous administration of radiopharmaceutical. After oral ingestion of Ensure, gallbladder ejection fraction was determined. At 60 min, normal ejection fraction is greater than 33%.   COMPARISON:  None.   RADIOPHARMACEUTICALS:  5.5 mCi Tc-57m Choletec   FINDINGS: There is immediate homogeneous uptake of radiotracer in the liver. Filling of the gallbladder begins at 15 minutes. Radiotracer uptake is present in the small bowel at 20 minutes.   At the end of 1-hour, there is relatively good clearance of activity from the liver.   When gallbladder filling was complete, the patient was given PO Ensure.   Gallbladder ejection fraction: 38.7%. Normal gallbladder  ejection fraction with Ensure is greater than 33%. The patient did not experience symptoms after oral ingestion of Ensure.   IMPRESSION: 1. Normal hepatobiliary scan. 2. The gallbladder ejection fraction measures 38.7% which is at the lower end of normal.      Assessment & Plan:   73 year old male with a past medical history of hypertension, hypercholesterolemia, GERD, and nonspecific neuromuscular disorder who is seen in followup.  1.  Gastritis/dyspepsia -- I do feel that some of his symptoms relate to his gastritis and dyspepsia. I would like him to restart Nexium 40 mg daily. We will discontinue she will say. He is avoiding NSAIDs, which I think should continue. Ibuprofen was likely one of the main contributors to his gastritis.  2.  Adrenal adenoma -- incidentally discovered by CT. However with his additional symptoms, most of which are somewhat ambiguous, I feel that endocrine referral to rule out hormonal hyperfunction is appropriate.  Endocrine referral placed today  3.  Insomnia -- I have recommended Benadryl 50-75 mg each bedtime as needed for sleep. He was previously taking 76 mg of diphenhydramine nightly as a component of 2 Advil PMs.    Followup after endocrinology evaluation

## 2013-09-10 NOTE — Patient Instructions (Signed)
We have sent the following medications to your pharmacy for you to pick up at your convenience: Nexium 40 mg daily  Discontinue taking carafate  Avoid NSAIDS  You can purchase bendryl over the counter 50 mg- 75 mg at bedtime  You have been referred to Dr. Cruzita Lederer at [facility]. If you do not hear from the by the end of the week please call their office at [phone]

## 2013-09-11 ENCOUNTER — Other Ambulatory Visit: Payer: Self-pay | Admitting: Gastroenterology

## 2013-09-11 DIAGNOSIS — D3500 Benign neoplasm of unspecified adrenal gland: Secondary | ICD-10-CM

## 2013-09-14 ENCOUNTER — Telehealth: Payer: Self-pay | Admitting: Internal Medicine

## 2013-09-14 DIAGNOSIS — D3500 Benign neoplasm of unspecified adrenal gland: Secondary | ICD-10-CM

## 2013-09-14 NOTE — Telephone Encounter (Signed)
Referral was not in the proper workque for endocrinology.  Referral placed again. I spoke with Dr. Arman Filter office they will have the referral coordinator contact the patient.  I have left a voicemail for the patient that he will be contacted by their office, but if he has further questions to please call me back.

## 2013-09-17 NOTE — Telephone Encounter (Signed)
Patient is scheduled with Dr. Cruzita Lederer on 09/24/13 11:15

## 2013-09-24 ENCOUNTER — Other Ambulatory Visit: Payer: Self-pay | Admitting: Internal Medicine

## 2013-09-24 ENCOUNTER — Ambulatory Visit (INDEPENDENT_AMBULATORY_CARE_PROVIDER_SITE_OTHER): Payer: Medicare Other | Admitting: Internal Medicine

## 2013-09-24 ENCOUNTER — Encounter: Payer: Self-pay | Admitting: Internal Medicine

## 2013-09-24 VITALS — BP 122/64 | HR 90 | Temp 97.9°F | Resp 12 | Ht 70.5 in | Wt 187.0 lb

## 2013-09-24 DIAGNOSIS — E278 Other specified disorders of adrenal gland: Secondary | ICD-10-CM | POA: Insufficient documentation

## 2013-09-24 MED ORDER — DEXAMETHASONE 1 MG PO TABS: ORAL_TABLET | ORAL | Status: DC

## 2013-09-24 NOTE — Patient Instructions (Signed)
Please stop at [facility] lab downstairs. Please try to join MyChart. Please come at our lab at 8 am after taking Dexamethasone tablet the night before at 11 pm. It is important to stop using the Nizoral cream today and also not to use any steroid cream/tablet or injection before the test.  Please return in 1 year.

## 2013-09-24 NOTE — Progress Notes (Addendum)
Patient ID: Manuel [name], male   DOB: 1940-12-14, 73 y.o.   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  Pt presented to Dr Hilarie Fredrickson for upper abdominal swelling, increased eructation >> had liver U/S, KUB, HIDA scan and CT abdomen >> all w/o indication of GI pathology. He also had EGD >> stomach ulcers. He started Nexium and Carafate >> stopped Carafate, but continues Nexium. This is not helping, then. The only relief is with walking >> can then burp and expel gas.   His CT scan showed an incidental adrenal lesion. I reviewed pt's previous CT scan  (09/03/13): "A left adrenal mass is seen which measures 2.6 x 3.0 cm. This shows relative contrast washout consistent with a benign adrenal adenoma. The right adrenal gland is normal in appearance."  Pt has a h/o HTN since the 1980s. Both his parents had HTN. Father died of an AMI at 42. This is usually controlled, except for one am, when he woke up feeling poorly and he checked his BP: 0000000 systolic. He does have a h/o HTN. No h/o spells with high BP, pallor, and HAs.   He does not have DM2, but has HL.   He lost weight intentionally, then unintentionally with his stomach problems. He lost from 210 >> 187 lbs few mo ago.  I reviewed his chart and he also has a history of viral meningitis in 1973.   ROS: Constitutional: + weight loss, no fatigue, no subjective hyperthermia/hypothermia, + poor sleep  Eyes: no blurry vision, no xerophthalmia ENT: no sore throat, no nodules palpated in throat, no dysphagia/odynophagia, no hoarseness Cardiovascular: no CP/SOB/palpitations/leg swelling Respiratory: no cough/SOB Gastrointestinal: no N/V/D/C, + GERD Musculoskeletal: no muscle/joint aches, R arm atrophic, cannot close fist; shiny  Skin: no rashes Neurological: no tremors/numbness/tingling/dizziness Psychiatric: no depression/anxiety  Past Medical  History  Diagnosis Date  . Hypertension   . Hypercholesteremia   . Family history of anesthesia complication     MOTHER   . GERD (gastroesophageal reflux disease)   . Neuromuscular disorder     ETIOLOGY UNKNOWN   . Arthritis    Past Surgical History  Procedure Laterality Date  . Colonoscopy      3-4 years ago   History   Social History  . Marital Status: Married    Spouse Name: N/A    Number of Children: 2   Occupational History  . retired   Social History Main Topics  . Smoking status: Former Smoker -- 1.00 packs/day for 20 years    Types: Cigarettes    Quit date: 05/11/1982  . Smokeless tobacco: Never Used  . Alcohol Use: 0.6 oz/week    1 Glasses of wine per week     Comment: socially  . Drug Use: No   Current Outpatient Prescriptions on File Prior to Visit  Medication Sig Dispense Refill  . aspirin 81 MG EC tablet Take 81 mg by mouth daily.        . calcipotriene (DOVONEX) 0.005 % cream Apply 1 application topically See admin instructions. During week days      . Cholecalciferol (VITAMIN D) 2000 UNITS CAPS Take 1 capsule by mouth daily.       . clonazePAM (KLONOPIN) 0.25 MG disintegrating tablet Take one tablet by mouth every 8 hours as needed for anxiety  5 tablet  0  . [facility] LANCETS THIN 26G MISC TEST EVERY DAY  100 each  6  . esomeprazole (NEXIUM) 40 MG capsule Take 1 capsule (40 mg total) by mouth 2 (two) times daily before a meal.  60 capsule  1  . glucose blood (ACCU-CHEK AVIVA PLUS) test strip 1 each by Other route daily before breakfast. Use as instructed  100 each  3  . ketoconazole (NIZORAL) 2 % cream Apply 1 application topically See admin instructions. On face and ears, on week days      . sucralfate (CARAFATE) 1 G tablet Take 1 tablet (1 g total) by mouth 4 (four) times daily -  with meals and at bedtime.  120 tablet  1  . triamcinolone cream (KENALOG) 0.1 % Apply 1 application topically See admin instructions. Apply once a day on weekends      .  valsartan-hydrochlorothiazide (DIOVAN-HCT) 160-12.5 MG per tablet Take 1 tablet by mouth daily.      Marland Kitchen atorvastatin (LIPITOR) 40 MG tablet Take 40 mg by mouth daily.      . ondansetron (ZOFRAN ODT) 4 MG disintegrating tablet Take 1 tablet (4 mg total) by mouth every 8 (eight) hours as needed for nausea or vomiting.  20 tablet  0   No current facility-administered medications on file prior to visit.   Allergies  Allergen Reactions  . Ace Inhibitors Cough   Family History  Problem Relation Age of Onset  . Heart attack Father    PE: Pulse 90  Temp(Src) 97.9 F (36.6 C) (Oral)  Resp 12  Ht 5' 10.5" (1.791 m)  Wt 187 lb (84.823 kg)  BMI 26.44 kg/m2  SpO2 95% Wt Readings from Last 3 Encounters:  09/24/13 187 lb (84.823 kg)  09/10/13 186 lb 3.2 oz (84.46 kg)  08/06/13 193 lb (87.544 kg)  BP 120 Constitutional: overweight, in NAD Eyes: PERRLA, EOMI, no exophthalmos, no lid lag, no stare ENT: moist mucous membranes, no thyromegaly, no cervical lymphadenopathy Cardiovascular: RRR, No MRG Respiratory: CTA B Gastrointestinal: abdomen soft, NT, ND, BS+ Musculoskeletal: no deformities, strength intact in all 4 Skin: moist, warm, no rashes Neurological: + tremor with outstretched hands: mostly in R hand, which appears weak and atrophic  ASSESSMENT: 1. Adrenal incidentaloma  PLAN:  1. Patient with a 2.6 x 3.0 cm L adrenal nodule discovered incidentally during w/u for GI sxs. - We discussed about the fact that there are 3 possible scenarios: - A nonfunctioning adrenal nodule - A functioning adrenal adenoma - which can hypersecrete catecholamines/metanephrines, cortisol, or aldosterone - Adrenal cancer/metastasis We do not have blood tests to check for cancer, but the best indicator is lack of change in size and appearance over time.  - To differentiate between a functioning and a nonfunctioning adrenal nodule, we'll need to rule out hypersecretion by checking the following tests  -  dexamethasone suppression test to rule out Cushing syndrome (6% of adrenal incidentalomas). Advised to hold off his Ketoconazole and Kenalog creams. If the cortisol level returns >5, will need 24h urine free cortisol - Plasma fractionated metanephrines and catecholamines to rule out pheochromocytoma (3% of adrenal incidentalomas) - Plasma renin activity and aldosterone level to rule out primary hyperaldosteronism (0.6% of adrenal incidentalomas) - I ordered the above tests today. I advised pt to take the dexamethasone tablets (1 mg total dose, sent to pharmacy) at 11 PM the night before coming to the lab to have a cortisol level drawn. I also added dexamethasone level. - We discussed about the need for an other CT scan, and I believe that we  can wait for another year and then have her back for repeat hormonal testing and ordering a dedicated adrenal CT. I will need to order it with and without contrast >> if the Hounsfield units are low and the lesion again appears benign, we might not need to get the contrasted CT for washout.  - we discussed about f/u:   hormonal testing yearly for 5 years   CT scans yearly x1-2 - I explained all the above to the patient, and he agrees with the plan. - I will see him back in a year assuming the above tests are normal   ALDOSTERONE + RENIN ACTIVITY W/ RATIO      Result Value Ref Range   PRA LC/MS/MS 6.14 (*) 0.25 - 5.82 ng/mL/h   ALDO / PRA Ratio 0.3 (*) 0.9 - 28.9 Ratio   Aldosterone 2     Potassium 4.2 (normal)    Narrative:    Performed at:  [facility]                [address], [address]                [place], [place] [address]  METANEPHRINES, PLASMA      Result Value Ref Range   Metanephrine, Free 29  <=57 pg/mL   Normetanephrine, Free 56  <=148 pg/mL   Total Metanephrines-Plasma 85  <=205 pg/mL   Narrative:    Performed at:  [address], [address]                [place], [name] [address]   CATECHOLAMINES, FRACTIONATED, PLASMA      Result Value Ref Range   Epinephrine 53  See below   Norepinephrine 601     Dopamine REPORT     Catecholamines, Total 654     Narrative:    Performed at:  [address], [address]                [place],  [address]   Epinephrine Supine: LESS THAN 50 pg/mL  Upright: LESS THAN 95 pg/mL Norepinephrine Supine: 112-658 pg/mL  Upright: [phone] pg/mL Dopamine Supine: LESS THAN 30 pg/mL  Upright: LESS THAN 30 pg/mL Total (N+E) Supine: 123-671 pg/mL  Upright: [phone] pg/mL  Tests do not indicate pheochromocytoma or primary hyperaldosteronism. The ARB can increase renin, but aldosterone very low, so no signs of hyperaldosteronism.  I am still awaiting the Dex suppression test result...  10/17/2013 Cortisol still pending. I will addend the results when they become available.  10/23/2013 Cortisol after Dexamethasone suppressed appropriately to 2.5 >> no sign of Cushing sd.

## 2013-09-25 LAB — POTASSIUM: Potassium: 4.2 mEq/L (ref 3.5–5.3)

## 2013-09-27 LAB — METANEPHRINES, PLASMA
METANEPHRINE FREE: 29 pg/mL (ref <=57)
NORMETANEPHRINE FREE: 56 pg/mL (ref <=148)
TOTAL METANEPHRINES-PLASMA: 85 pg/mL (ref <=205)

## 2013-09-28 LAB — CATECHOLAMINES, FRACTIONATED, PLASMA
CATECHOLAMINES, TOTAL: 654 pg/mL
Epinephrine: 53 pg/mL
Norepinephrine: 601 pg/mL

## 2013-10-01 ENCOUNTER — Encounter: Payer: Self-pay | Admitting: Internal Medicine

## 2013-10-01 ENCOUNTER — Telehealth: Payer: Self-pay | Admitting: Internal Medicine

## 2013-10-01 LAB — ALDOSTERONE + RENIN ACTIVITY W/ RATIO
ALDO / PRA RATIO: 0.3 ratio — AB (ref 0.9–28.9)
Aldosterone: 2 ng/dL
PRA LC/MS/MS: 6.14 ng/mL/h — ABNORMAL HIGH (ref 0.25–5.82)

## 2013-10-01 NOTE — Telephone Encounter (Signed)
Trial of Dexilant 60 mg daily, 30 min before breakfast I see that he is having endocrine eval for adrenal adenoma by Dr. Cruzita Lederer. We will await her findings Have him let us know if not better with new PPI

## 2013-10-01 NOTE — Telephone Encounter (Signed)
nexium is not controlling symptoms. Please advise the next step

## 2013-10-02 MED ORDER — DEXLANSOPRAZOLE 60 MG PO CPDR: 60.0000 mg | DELAYED_RELEASE_CAPSULE | Freq: Every day | ORAL | Status: DC

## 2013-10-02 NOTE — Telephone Encounter (Signed)
Patient notified He will pick up [place] and let us know if it does not help

## 2013-10-02 NOTE — Addendum Note (Signed)
Addended by: Marlon Pel on: 10/02/2013 08:59 AM   Modules accepted: Orders, Medications

## 2013-10-04 ENCOUNTER — Ambulatory Visit (INDEPENDENT_AMBULATORY_CARE_PROVIDER_SITE_OTHER): Payer: Medicare Other | Admitting: Internal Medicine

## 2013-10-04 ENCOUNTER — Encounter: Payer: Self-pay | Admitting: Internal Medicine

## 2013-10-04 VITALS — BP 128/78 | HR 80 | Ht 70.0 in | Wt 186.0 lb

## 2013-10-04 DIAGNOSIS — R634 Abnormal weight loss: Secondary | ICD-10-CM

## 2013-10-04 DIAGNOSIS — K2970 Gastritis, unspecified, without bleeding: Secondary | ICD-10-CM

## 2013-10-04 DIAGNOSIS — K219 Gastro-esophageal reflux disease without esophagitis: Secondary | ICD-10-CM

## 2013-10-04 DIAGNOSIS — K2990 Gastroduodenitis, unspecified, without bleeding: Secondary | ICD-10-CM

## 2013-10-04 DIAGNOSIS — R109 Unspecified abdominal pain: Secondary | ICD-10-CM

## 2013-10-04 LAB — CBC WITH DIFFERENTIAL/PLATELET
BASOS ABS: 0 10*3/uL (ref 0.0–0.1)
BASOS PCT: 0 % (ref 0–1)
Eosinophils Absolute: 0.1 10*3/uL (ref 0.0–0.7)
Eosinophils Relative: 1 % (ref 0–5)
HCT: 39.6 % (ref 39.0–52.0)
Hemoglobin: 14 g/dL (ref 13.0–17.0)
LYMPHS PCT: 24 % (ref 12–46)
Lymphs Abs: 1.7 10*3/uL (ref 0.7–4.0)
MCH: 31.7 pg (ref 26.0–34.0)
MCHC: 35.4 g/dL (ref 30.0–36.0)
MCV: 89.8 fL (ref 78.0–100.0)
Monocytes Absolute: 0.9 10*3/uL (ref 0.1–1.0)
Monocytes Relative: 12 % (ref 3–12)
NEUTROS ABS: 4.5 10*3/uL (ref 1.7–7.7)
NEUTROS PCT: 63 % (ref 43–77)
Platelets: 272 10*3/uL (ref 150–400)
RBC: 4.41 MIL/uL (ref 4.22–5.81)
RDW: 13.8 % (ref 11.5–15.5)
WBC: 7.1 10*3/uL (ref 4.0–10.5)

## 2013-10-04 NOTE — Patient Instructions (Addendum)
Take Carafate  on empty stomach  4 times  Daily. Take Dexilant 60 mg daily. Samples provided. Call with progress report in 2 weeks.

## 2013-10-05 LAB — COMPREHENSIVE METABOLIC PANEL
ALK PHOS: 71 U/L (ref 39–117)
ALT: 17 U/L (ref 0–53)
AST: 16 U/L (ref 0–37)
Albumin: 4.4 g/dL (ref 3.5–5.2)
BUN: 16 mg/dL (ref 6–23)
CO2: 29 mEq/L (ref 19–32)
Calcium: 9.7 mg/dL (ref 8.4–10.5)
Chloride: 101 mEq/L (ref 96–112)
Creat: 0.89 mg/dL (ref 0.50–1.35)
Glucose, Bld: 90 mg/dL (ref 70–99)
POTASSIUM: 3.8 meq/L (ref 3.5–5.3)
Sodium: 138 mEq/L (ref 135–145)
TOTAL PROTEIN: 7 g/dL (ref 6.0–8.3)
Total Bilirubin: 0.6 mg/dL (ref 0.2–1.2)

## 2013-10-05 LAB — CANCER ANTIGEN 19-9: CA 19-9: 18.5 U/mL (ref <35.0)

## 2013-10-10 ENCOUNTER — Telehealth: Payer: Self-pay | Admitting: *Deleted

## 2013-10-10 NOTE — Telephone Encounter (Signed)
Called pt back and lvm advising him about taking the decadron pill at 11 pm the night before the blood draw. Advised pt to call and let me know which day he plans to come so that I can put him on the lab schedule.

## 2013-10-11 ENCOUNTER — Other Ambulatory Visit: Payer: Self-pay | Admitting: *Deleted

## 2013-10-11 ENCOUNTER — Other Ambulatory Visit: Payer: Medicare Other

## 2013-10-11 DIAGNOSIS — E278 Other specified disorders of adrenal gland: Secondary | ICD-10-CM

## 2013-10-12 LAB — CORTISOL: Cortisol, Plasma: 2.6 ug/dL

## 2013-10-15 ENCOUNTER — Telehealth: Payer: Self-pay | Admitting: Internal Medicine

## 2013-10-16 NOTE — Telephone Encounter (Signed)
Dr. Renold Genta advised to have patient stop Dexilant and resume Nexium 40mg  daily.

## 2013-10-16 NOTE — Telephone Encounter (Signed)
Stop Dexilant.  Start Nexium 40 mg daily. See Thursday.

## 2013-10-17 ENCOUNTER — Encounter: Payer: Self-pay | Admitting: Internal Medicine

## 2013-10-21 DIAGNOSIS — Z8711 Personal history of peptic ulcer disease: Secondary | ICD-10-CM | POA: Insufficient documentation

## 2013-10-21 NOTE — Progress Notes (Signed)
   Subjective:    Patient ID: Manuel [name], male    DOB: Jan 11, 1941, 73 y.o.   MRN: [id number]  HPI Manuel [name] continues to have issues with abdominal pain. He's been placed on Carafate and Nexium for gastroenterologist. Still has epigastric pain. He has a sister who died of complications of gallbladder cancer which is extremely rare. I think he is worried about this. I spent considerable time with him today going over his symptoms and his medication regimen. Also went over his results with him today.  In January 2015 Dr. Hilarie Fredrickson did an endoscopy which showed a papule in his esophagus a 4-5 mm in size which was biopsied. 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 There was no regular Z line in the esophagus and multiple biopsies were taken at that site. The main finding was that he had ulcerated acute gastritis with erosions at multiple sites in his stomach. All of the biopsies were consistent with inflammation.  Z line biopsy consistent with reflux. No cancer or metaplasia was detected. No evidence of H. pylori. We have drawn some lab work today including a CBC, C. met and CA 19-9 all of which proved to be normal.     Review of Systems     Objective:   Physical Exam He has some mild epigastric tenderness without rebound tenderness. No hepatosplenomegaly or masses appreciated.       Assessment & Plan:  GE reflux  Acute gastritis of stomach  Plan: Change from Nexium to Dexilant 60 mg daily. Take Carafate 1 g 4 times daily on an anti-stomach. Explained to him it may take several weeks for the symptoms to resolve but that no cancer whatsoever had been detected. He is to let me know how he's doing in a couple of weeks.  25 minutes spent with patient

## 2013-10-23 ENCOUNTER — Ambulatory Visit (INDEPENDENT_AMBULATORY_CARE_PROVIDER_SITE_OTHER): Payer: Medicare Other | Admitting: Internal Medicine

## 2013-10-23 ENCOUNTER — Encounter: Payer: Self-pay | Admitting: Internal Medicine

## 2013-10-23 VITALS — BP 116/84 | HR 96 | Temp 98.6°F | Wt 183.5 lb

## 2013-10-23 DIAGNOSIS — K219 Gastro-esophageal reflux disease without esophagitis: Secondary | ICD-10-CM

## 2013-10-23 LAB — DEXAMETHASONE, BLOOD: DEXAMETHASONE, SERUM: 468 ng/dL

## 2013-10-23 MED ORDER — RANITIDINE HCL 300 MG PO TABS: 300.0000 mg | ORAL_TABLET | Freq: Every day | ORAL | Status: DC

## 2013-10-23 NOTE — Patient Instructions (Addendum)
Continue Carafate. Stop Nexium and Dexilant. Try ranitidine 300 mg daily. Call with progress report in 2 weeks.

## 2013-10-25 ENCOUNTER — Telehealth: Payer: Self-pay | Admitting: *Deleted

## 2013-10-25 NOTE — Telephone Encounter (Signed)
Called pt and lvm to call back and let us know which day he plans to come for labs so we can put him on the lab schedule.

## 2013-10-25 NOTE — Telephone Encounter (Signed)
Pt is returning Shannon's call

## 2013-10-25 NOTE — Telephone Encounter (Signed)
Spoke with pt and he had test completed. Read him the results. Pt pleased.

## 2013-10-30 NOTE — Progress Notes (Signed)
   Subjective:    Patient ID: Manuel [name], male    DOB: [birth year], 73 y.o.   MRN: [id number]  HPI Seems to be confused that medication regimen. Last visit we advised him to take Carafate 1 g 4 times daily on an empty stomach. He was also to take [place]. Says that he thinks Nexium and Dexilant are causing some loose stool and urgency but not frank diarrhea. This is been reported in a small percentage of patients. Seems to continue to have some issues with epigastric pain. He's had an extensive workup. Low normal ejection fraction on hepatobiliary study. He may have some gallbladder disease that is occult. No gallstones have been detected. One option would be to consider surgical consultation to see if he needs to have his gallbladder removed if symptoms persist. He was told this today.    Review of Systems     Objective:   Physical Exam  Not examined today. Was examined at last visit. Spent 15 minutes talking with him about medication regimen. Seems that PPIs that we have recommended did not agree with him.      Assessment & Plan:  Epigastric pain  Gastritis  Low normal ejection fraction on hepatobiliary study  Plan: I think he is worried about his GI system, sister had a history of cancer of the gallbladder. He's going to try Zantac instead of PPI and let me know how this works in the next couple of weeks. He can continue Carafate on anti-stomach

## 2013-11-15 ENCOUNTER — Telehealth: Payer: Self-pay | Admitting: Internal Medicine

## 2013-11-15 NOTE — Telephone Encounter (Signed)
Needs to keep taking Zantac indefinitely for GE reflux. No harm in taking it long term.

## 2013-11-16 NOTE — Telephone Encounter (Signed)
Patient advised of Dr. Verlene Mayer recommendations.

## 2013-11-29 ENCOUNTER — Encounter: Payer: Self-pay | Admitting: Family Medicine

## 2013-11-29 ENCOUNTER — Ambulatory Visit (INDEPENDENT_AMBULATORY_CARE_PROVIDER_SITE_OTHER): Payer: Medicare Other | Admitting: Family Medicine

## 2013-11-29 VITALS — BP 122/82 | HR 84 | Wt 183.0 lb

## 2013-11-29 DIAGNOSIS — R109 Unspecified abdominal pain: Secondary | ICD-10-CM

## 2013-11-29 DIAGNOSIS — Z8719 Personal history of other diseases of the digestive system: Secondary | ICD-10-CM

## 2013-11-29 DIAGNOSIS — R7309 Other abnormal glucose: Secondary | ICD-10-CM

## 2013-11-29 DIAGNOSIS — Z23 Encounter for immunization: Secondary | ICD-10-CM

## 2013-11-29 DIAGNOSIS — R7302 Impaired glucose tolerance (oral): Secondary | ICD-10-CM

## 2013-11-29 DIAGNOSIS — R29898 Other symptoms and signs involving the musculoskeletal system: Secondary | ICD-10-CM

## 2013-11-29 DIAGNOSIS — M6281 Muscle weakness (generalized): Secondary | ICD-10-CM

## 2013-11-29 DIAGNOSIS — N400 Enlarged prostate without lower urinary tract symptoms: Secondary | ICD-10-CM

## 2013-11-29 NOTE — Progress Notes (Signed)
   Subjective:    Patient ID: Manuel [name], male    DOB: Jan 02, 1941, 73 y.o.   MRN: [id number]  HPI He is here for an initial visit. He he has had a several month history of difficulty with abdominal distress. His record was reviewed and does show an extensive workup for this. The only thing that was definitively found apparently was gastritis. He has been on PPI and presently is on ranitidine. He now states that he is having no more discomfort. Evaluation also noted an enlarged prostate however he is having no major symptoms from that. He also has a history of glucose intolerance. He also has weakness of the right hand with atrophy. Apparently this was extensively worked up approximately 30 years ago including an evaluation at [facility]. Apparently no definitive diagnosis was made. He has no other concerns or complaints.   Review of Systems     Objective:   Physical Exam Alert and in no distress otherwise not examined       Assessment & Plan:  Need for vaccination with 13-polyvalent pneumococcal conjugate vaccine - Plan: Pneumococcal conjugate vaccine 13-valent  Abdominal pain, unspecified site  History of gastritis  Right hand weakness  BPH (benign prostatic hyperplasia)  Impaired glucose tolerance  I discussed the fact that since he is not having any trouble, he should continue on his present medication regimen. I reviewed his immunizations and he does need a pneumococcal vaccine. Also suggested he come back for a complete examination.

## 2013-12-14 ENCOUNTER — Other Ambulatory Visit: Payer: Self-pay | Admitting: Internal Medicine

## 2013-12-24 ENCOUNTER — Other Ambulatory Visit: Payer: Self-pay | Admitting: Internal Medicine

## 2013-12-26 ENCOUNTER — Other Ambulatory Visit: Payer: Self-pay | Admitting: Family Medicine

## 2013-12-28 ENCOUNTER — Encounter: Payer: Self-pay | Admitting: Internal Medicine

## 2013-12-28 ENCOUNTER — Other Ambulatory Visit: Payer: Self-pay | Admitting: Internal Medicine

## 2013-12-28 MED ORDER — FAMOTIDINE 20 MG PO TABS: 20.0000 mg | ORAL_TABLET | Freq: Two times a day (BID) | ORAL | Status: DC

## 2013-12-31 ENCOUNTER — Telehealth: Payer: Self-pay | Admitting: Gastroenterology

## 2013-12-31 ENCOUNTER — Telehealth: Payer: Self-pay | Admitting: Internal Medicine

## 2013-12-31 NOTE — Telephone Encounter (Signed)
Called home number no answer no VM; called cell  lvm for pt to call me back.

## 2013-12-31 NOTE — Telephone Encounter (Signed)
Pt wanted to make sure he was taking pepcid as directed because she is also taking Zantac at bedtime for breakthrough gerd. Pt had direction correct. And will call in a few months to follow up with dr. Hilarie Fredrickson in office.

## 2014-01-07 ENCOUNTER — Ambulatory Visit (INDEPENDENT_AMBULATORY_CARE_PROVIDER_SITE_OTHER): Payer: Medicare Other | Admitting: Family Medicine

## 2014-01-07 ENCOUNTER — Encounter: Payer: Self-pay | Admitting: Family Medicine

## 2014-01-07 VITALS — BP 110/72 | HR 80 | Ht 70.0 in | Wt 189.0 lb

## 2014-01-07 DIAGNOSIS — M6281 Muscle weakness (generalized): Secondary | ICD-10-CM

## 2014-01-07 DIAGNOSIS — Z0000 Encounter for general adult medical examination without abnormal findings: Secondary | ICD-10-CM

## 2014-01-07 DIAGNOSIS — R29898 Other symptoms and signs involving the musculoskeletal system: Secondary | ICD-10-CM

## 2014-01-07 DIAGNOSIS — G4733 Obstructive sleep apnea (adult) (pediatric): Secondary | ICD-10-CM

## 2014-01-07 DIAGNOSIS — K2990 Gastroduodenitis, unspecified, without bleeding: Secondary | ICD-10-CM

## 2014-01-07 DIAGNOSIS — K2970 Gastritis, unspecified, without bleeding: Secondary | ICD-10-CM

## 2014-01-07 DIAGNOSIS — R7309 Other abnormal glucose: Secondary | ICD-10-CM

## 2014-01-07 DIAGNOSIS — E785 Hyperlipidemia, unspecified: Secondary | ICD-10-CM

## 2014-01-07 DIAGNOSIS — I10 Essential (primary) hypertension: Secondary | ICD-10-CM

## 2014-01-07 DIAGNOSIS — J309 Allergic rhinitis, unspecified: Secondary | ICD-10-CM

## 2014-01-07 DIAGNOSIS — R7302 Impaired glucose tolerance (oral): Secondary | ICD-10-CM

## 2014-01-07 LAB — POCT URINALYSIS DIPSTICK
BILIRUBIN UA: NEGATIVE
Blood, UA: NEGATIVE
Glucose, UA: NEGATIVE
Ketones, UA: NEGATIVE
LEUKOCYTES UA: NEGATIVE
NITRITE UA: NEGATIVE
PH UA: 6
Protein, UA: NEGATIVE
Spec Grav, UA: 1.015
UROBILINOGEN UA: NEGATIVE

## 2014-01-07 LAB — POCT GLYCOSYLATED HEMOGLOBIN (HGB A1C): Hemoglobin A1C: 5.6

## 2014-01-07 NOTE — Progress Notes (Signed)
Subjective:    Patient ID: Manuel [name], male    DOB: Feb 22, 1941, 73 y.o.   MRN: [id number]  HPI He is here for complete examination. He has no particular concerns. Review his record indicates he does have OSA however he is now on CPAP. He recently has had difficulty with abdominal symptoms and the evaluation did show some gastritis. Presently he is getting two H2 blockers. He did try Nexium in the past however cause like diarrhea. He does state that he is getting relief with the twice a day dosing of his H2 blocker. He continues on his blood pressure medication. He is also stop taking his Lipitor stating he was having some difficulty with myalgias. This was recommended by his previous physician. He has a previous history of headache however has not had trouble with this in several years. This occurred after he had a myelogram for evaluation of his right arm weakness. No particular diagnosis was given for the weakness. He does have a sebaceous cyst that was removed several years ago and has now started to grow but is not giving him difficulty. He has a previous history of glucose intolerance with an A1c in the low 6 range. He has made some dietary and exercise changes. Social and family history were reviewed. He is retired. His marriage is going well. He has had a colonoscopy.  Review of Systems  All other systems reviewed and are negative.      Objective:   Physical Exam BP 110/72  Pulse 80  Ht 5\' 10"  (1.778 m)  Wt 189 lb (85.73 kg)  BMI 27.12 kg/m2  General Appearance:    Alert, cooperative, no distress, appears stated age  Head:    Normocephalic, without obvious abnormality, atraumatic  Eyes:    PERRL, conjunctiva/corneas clear, EOM's intact, fundi    benign  Ears:    Normal TM's and external ear canals  Nose:   Nares normal, mucosa normal, no drainage or sinus   tenderness  Throat:   Lips, mucosa, and tongue normal; teeth and gums normal  Neck:   Supple, no lymphadenopathy;   thyroid:  no   enlargement/tenderness/nodules; no carotid    bruit or JVD  Back:    Spine nontender, no curvature, ROM normal, no CVA     tenderness  Lungs:     Clear to auscultation bilaterally without wheezes, rales or     ronchi; respirations unlabored  Chest Wall:    No tenderness or deformity   Heart:    Regular rate and rhythm, S1 and S2 normal, no murmur, rub   or gallop  Breast Exam:    No chest wall tenderness, masses or gynecomastia  Abdomen:     Soft, non-tender, nondistended, normoactive bowel sounds,    no masses, no hepatosplenomegaly        Extremities:   No clubbing, cyanosis or edema  Pulses:   2+ and symmetric all extremities  Skin:   Skin color, texture, turgor normal, no rashes or lesions  Lymph nodes:   Cervical, supraclavicular, and axillary nodes normal  Neurologic:   CNII-XII intact, normal strength, sensation and gait; reflexes 2+ and symmetric throughout.right-sided arm weakness and atrophy is noted.           Psych:   Normal mood, affect, hygiene and grooming.   hemoglobin A1c is 5.6       Assessment & Plan:  Routine general medical examination at a health care facility - Plan: Urinalysis Dipstick  OBSTRUCTIVE  SLEEP APNEA  HYPERLIPIDEMIA - Plan: Lipid panel  HYPERTENSION  ALLERGIC RHINITIS  Right hand weakness  Impaired glucose tolerance - Plan: HgB A1c  Unspecified gastritis and gastroduodenitis without mention of hemorrhage  we will get his sleep apnea information and evaluate that. He will continue on his present medications and stop He is to continue on Pepcid then stop his other H2 blocker.

## 2014-01-08 LAB — LIPID PANEL
Cholesterol: 251 mg/dL — ABNORMAL HIGH (ref 0–200)
HDL: 57 mg/dL (ref >39)
LDL Cholesterol: 160 mg/dL — ABNORMAL HIGH (ref 0–99)
TRIGLYCERIDES: 172 mg/dL — AB (ref <150)
Total CHOL/HDL Ratio: 4.4 Ratio
VLDL: 34 mg/dL (ref 0–40)

## 2014-01-10 ENCOUNTER — Encounter: Payer: Self-pay | Admitting: Family Medicine

## 2014-01-15 ENCOUNTER — Encounter: Payer: Self-pay | Admitting: Family Medicine

## 2014-01-15 ENCOUNTER — Ambulatory Visit (INDEPENDENT_AMBULATORY_CARE_PROVIDER_SITE_OTHER): Payer: Medicare Other | Admitting: Family Medicine

## 2014-01-15 VITALS — Wt 188.0 lb

## 2014-01-15 DIAGNOSIS — L723 Sebaceous cyst: Secondary | ICD-10-CM

## 2014-01-15 DIAGNOSIS — L089 Local infection of the skin and subcutaneous tissue, unspecified: Secondary | ICD-10-CM

## 2014-01-15 NOTE — Patient Instructions (Signed)
Keep the dressing on and if it bleeds through put something over the top of it

## 2014-01-15 NOTE — Progress Notes (Signed)
   Subjective:    Patient ID: Manuel [name], male    DOB: [birth year], 73 y.o.   MRN: [id number]  HPI He is here for evaluation of a cyst that has become infected. He did have it removed several years ago however it has grown and now is gotten warm and tender.   Review of Systems     Objective:   Physical Exam A 4 cm red tender lesion with central horizontal scar is noted on the lower mid back area.       Assessment & Plan:  Infected sebaceous cyst  the lesion was injected with Xylocaine a 3 cm incision was made over the same scars of previous. Purulent material was expressed as well as the cyst sac. The wound was cleaned and packed without a form. He will return here in 2 days for partial removal.

## 2014-01-17 ENCOUNTER — Ambulatory Visit (INDEPENDENT_AMBULATORY_CARE_PROVIDER_SITE_OTHER): Payer: Medicare Other | Admitting: Family Medicine

## 2014-01-17 DIAGNOSIS — L089 Local infection of the skin and subcutaneous tissue, unspecified: Secondary | ICD-10-CM

## 2014-01-17 DIAGNOSIS — L723 Sebaceous cyst: Secondary | ICD-10-CM

## 2014-01-17 NOTE — Progress Notes (Signed)
   Subjective:    Patient ID: Manuel [name], male    DOB: 06-Jul-1941, 73 y.o.   MRN: [id number]  HPI He is here for a wound recheck. He is having very little difficulty.   Review of Systems     Objective:   Physical Exam Exam of the lesion on his back shows less erythema and tenderness. Approximately 1 foot of packing was removed.       Assessment & Plan:  Infected sebaceous cyst  he is to return Monday and I will remove the rest of the packing.

## 2014-01-21 ENCOUNTER — Ambulatory Visit (INDEPENDENT_AMBULATORY_CARE_PROVIDER_SITE_OTHER): Payer: Medicare Other | Admitting: Family Medicine

## 2014-01-21 DIAGNOSIS — L089 Local infection of the skin and subcutaneous tissue, unspecified: Secondary | ICD-10-CM

## 2014-01-21 DIAGNOSIS — L723 Sebaceous cyst: Secondary | ICD-10-CM

## 2014-01-21 NOTE — Progress Notes (Signed)
   Subjective:    Patient ID: Manuel [name], male    DOB: 11/23/1940, 73 y.o.   MRN: [id number]  HPI He is here for packing removal. He has had no difficulty with the wound over the weekend.   Review of Systems     Objective:   Physical Exam Approximately 8 inches of packing was removed from the wound. The wound is healing nicely with no drainage or erythema.       Assessment & Plan:  Infected sebaceous cyst  recommend irrigating it with warm water 2 or 3 times per day. He will also use Neosporin ointment. Once the wound is dry, no further intervention necessary.

## 2014-02-04 ENCOUNTER — Telehealth: Payer: Self-pay | Admitting: Family Medicine

## 2014-02-04 NOTE — Telephone Encounter (Signed)
Pt called and left message on voice mail.  He states he has seen Dr. Hilarie Fredrickson for  GI exam and needs to go back to him in August.  His question to [place] is will his insurance pay.   i advised him when he call Dr. Hilarie Fredrickson office and ask them about his visit and if he needs to do anything different regarding his insurance.   He will have Dr. Hilarie Fredrickson send [place] a copy of the report.

## 2014-02-25 ENCOUNTER — Ambulatory Visit (INDEPENDENT_AMBULATORY_CARE_PROVIDER_SITE_OTHER): Payer: Medicare Other | Admitting: Family Medicine

## 2014-02-25 VITALS — BP 122/70

## 2014-02-25 DIAGNOSIS — L723 Sebaceous cyst: Secondary | ICD-10-CM

## 2014-02-25 NOTE — Progress Notes (Signed)
   Subjective:    Patient ID: Manuel [name], male    DOB: 1940/11/05, 73 y.o.   MRN: [id number]  HPI He is here for reevaluation of a cyst on his back. His wife thinks it is growing again.   Review of Systems     Objective:   Physical Exam Exam of the previous scar does show good healing. Distal to the scar the skin feels normal. Proximal there is some fullness.       Assessment & Plan:  Sebaceous cyst  I explained that this could indeed be the cyst reforming the as long as it doesn't cause any difficulties mainly getting infected, nothing else needs to be done. He is comfortable with this.

## 2014-03-28 ENCOUNTER — Encounter: Payer: Self-pay | Admitting: Internal Medicine

## 2014-03-28 ENCOUNTER — Ambulatory Visit (INDEPENDENT_AMBULATORY_CARE_PROVIDER_SITE_OTHER): Payer: Medicare Other | Admitting: Internal Medicine

## 2014-03-28 VITALS — BP 130/76 | HR 92 | Ht 70.0 in | Wt 190.2 lb

## 2014-03-28 DIAGNOSIS — K30 Functional dyspepsia: Secondary | ICD-10-CM

## 2014-03-28 DIAGNOSIS — K219 Gastro-esophageal reflux disease without esophagitis: Secondary | ICD-10-CM

## 2014-03-28 DIAGNOSIS — K3189 Other diseases of stomach and duodenum: Secondary | ICD-10-CM

## 2014-03-28 DIAGNOSIS — K2960 Other gastritis without bleeding: Secondary | ICD-10-CM

## 2014-03-28 DIAGNOSIS — R1013 Epigastric pain: Secondary | ICD-10-CM

## 2014-03-28 MED ORDER — FAMOTIDINE 20 MG PO TABS: 20.0000 mg | ORAL_TABLET | Freq: Two times a day (BID) | ORAL | Status: DC

## 2014-03-28 NOTE — Patient Instructions (Signed)
Continue your current medications as directed. We have sent refills for famotidine for 1 year. Please follow up in 1 year. CC:  Jill Alexanders MD

## 2014-03-28 NOTE — Progress Notes (Signed)
Subjective:    Patient ID: Manuel [name], male    DOB: 1941-01-19, 73 y.o.   MRN: [id number]  HPI Manuel [name] is a 73 year old male with a past medical history of erosive gastritis, GERD, hypertension, hyperlipidemia, and nonspecific neuromuscular disorder who is seen for followup. He is here alone today. He's been doing well. Appetite has been good and he feels that his eating has been better. Very occasionally he'll feel an upper abdominal full sensation, earlier than he feels his normal, but overall this has improved. He has gained approximately 4 pounds since being seen here in February. He has tried to avoid greasy food. Bowel movements have been "fine" and described as normal, brown and formed. No diarrhea or constipation. No blood in his stool or melena. He is using famotidine 20 mg twice daily and with this has had no heartburn, dysphagia or odynophagia. No epigastric burning or pain. No itching, jaundice or other hepatobiliary complaint.  His wife has been given with C. difficile colitis and has been taking several medications and has an upcoming fecal transplant in [place] with Dr. Vira Agar   Review of Systems As per history of present illness, otherwise negative  Current Medications, Allergies, Past Medical History, Past Surgical History, Family History and Social History were reviewed in [facility] record.     Objective:   Physical Exam BP 130/76  Pulse 92  Ht 5\' 10"  (1.778 m)  Wt 190 lb 3.2 oz (86.274 kg)  BMI 27.29 kg/m2 Constitutional: Well-developed and well-nourished. No distress. HEENT: Normocephalic and atraumatic. Oropharynx is clear and moist. No oropharyngeal exudate. Conjunctivae are normal.  No scleral icterus. Cardiovascular: Normal rate, regular rhythm and intact distal pulses. No M/R/G Pulmonary/chest: Effort normal and breath sounds normal. No wheezing, rales or rhonchi. Abdominal: Soft, nontender, nondistended. Bowel sounds active  throughout. There are no masses palpable. No hepatosplenomegaly. Extremities: no clubbing, cyanosis, or edema Lymphadenopathy: No cervical adenopathy noted. Neurological: Alert and oriented to person place and time. Psychiatric: Normal mood and affect. Behavior is normal.  CBC    Component Value Date/Time   WBC 7.1 10/04/2013 1657   RBC 4.41 10/04/2013 1657   HGB 14.0 10/04/2013 1657   HCT 39.6 10/04/2013 1657   PLT 272 10/04/2013 1657   MCV 89.8 10/04/2013 1657   MCH 31.7 10/04/2013 1657   MCHC 35.4 10/04/2013 1657   RDW 13.8 10/04/2013 1657   LYMPHSABS 1.7 10/04/2013 1657   MONOABS 0.9 10/04/2013 1657   EOSABS 0.1 10/04/2013 1657   BASOSABS 0.0 10/04/2013 1657    CMP     Component Value Date/Time   NA 138 10/04/2013 1657   K 3.8 10/04/2013 1657   CL 101 10/04/2013 1657   CO2 29 10/04/2013 1657   GLUCOSE 90 10/04/2013 1657   BUN 16 10/04/2013 1657   CREATININE 0.89 10/04/2013 1657   CREATININE 0.82 07/30/2013 1832   CALCIUM 9.7 10/04/2013 1657   PROT 7.0 10/04/2013 1657   ALBUMIN 4.4 10/04/2013 1657   AST 16 10/04/2013 1657   ALT 17 10/04/2013 1657   ALKPHOS 71 10/04/2013 1657   BILITOT 0.6 10/04/2013 1657   GFRNONAA 86* 07/30/2013 1832   GFRAA >90 07/30/2013 1832    CT abd/pelvis reviewed    Assessment & Plan:  73 year old male with a past medical history of erosive gastritis, GERD, hypertension, hyperlipidemia, and nonspecific neuromuscular disorder who is seen for followup.  1. Erosive gastritis/GERD -- he is doing well, I feel he likely has a  component of functional dyspepsia in addition to the gastritis that was diagnosed by endoscopy earlier this year. He is doing well with twice daily famotidine we will continue 20 mg twice daily. There are no alarm symptoms such as ongoing pain or weight loss. No nausea or vomiting. He is happy with current treatment and I will followup with him for this in one year, sooner if necessary  2. CRC screening -- colonoscopy 2010, without polyps, would be due in 2020  3. 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 Felt to be benign by imaging.

## 2014-05-14 ENCOUNTER — Other Ambulatory Visit (INDEPENDENT_AMBULATORY_CARE_PROVIDER_SITE_OTHER): Payer: Medicare Other

## 2014-05-14 DIAGNOSIS — Z23 Encounter for immunization: Secondary | ICD-10-CM

## 2014-08-28 ENCOUNTER — Other Ambulatory Visit: Payer: Self-pay | Admitting: Internal Medicine

## 2014-08-29 ENCOUNTER — Telehealth: Payer: Self-pay

## 2014-08-29 ENCOUNTER — Telehealth: Payer: Self-pay | Admitting: Family Medicine

## 2014-08-29 ENCOUNTER — Other Ambulatory Visit: Payer: Self-pay

## 2014-08-29 MED ORDER — VALSARTAN-HYDROCHLOROTHIAZIDE 160-12.5 MG PO TABS: 1.0000 | ORAL_TABLET | Freq: Every day | ORAL | Status: DC

## 2014-08-29 NOTE — Telephone Encounter (Signed)
Pt came by for refill Valsartan, advised [name] will refill for 30 days but needs appt.  He will call this afternoon and schedule appt with [name].

## 2014-08-29 NOTE — Telephone Encounter (Signed)
[name], You are correct, he was receiving this medication from his previous physician.

## 2014-08-29 NOTE — Telephone Encounter (Signed)
Pt called and left message on voice mail that he needs refill on his valsartin hctz to [facility]

## 2014-08-29 NOTE — Telephone Encounter (Signed)
Patient came in I refilled the med for 30 days and pt needs appointment he was informed by laura

## 2014-08-29 NOTE — Telephone Encounter (Signed)
Manuel [name] we have never filled the med for this pt I called and left a message for him that what I could tell we have never filled this med for him he has had 4 past visit for cyst and in 12/2013 he had a cpe but I still dont see where we have filled or anyone in the [facility] order since 2014

## 2014-09-03 ENCOUNTER — Ambulatory Visit (INDEPENDENT_AMBULATORY_CARE_PROVIDER_SITE_OTHER): Payer: Medicare Other | Admitting: Family Medicine

## 2014-09-03 ENCOUNTER — Encounter: Payer: Self-pay | Admitting: Family Medicine

## 2014-09-03 VITALS — BP 120/82 | HR 86 | Wt 190.0 lb

## 2014-09-03 DIAGNOSIS — I700 Atherosclerosis of aorta: Secondary | ICD-10-CM

## 2014-09-03 DIAGNOSIS — L409 Psoriasis, unspecified: Secondary | ICD-10-CM

## 2014-09-03 DIAGNOSIS — R7302 Impaired glucose tolerance (oral): Secondary | ICD-10-CM

## 2014-09-03 DIAGNOSIS — E785 Hyperlipidemia, unspecified: Secondary | ICD-10-CM

## 2014-09-03 DIAGNOSIS — R29898 Other symptoms and signs involving the musculoskeletal system: Secondary | ICD-10-CM

## 2014-09-03 DIAGNOSIS — J301 Allergic rhinitis due to pollen: Secondary | ICD-10-CM

## 2014-09-03 DIAGNOSIS — E559 Vitamin D deficiency, unspecified: Secondary | ICD-10-CM

## 2014-09-03 DIAGNOSIS — M6289 Other specified disorders of muscle: Secondary | ICD-10-CM

## 2014-09-03 DIAGNOSIS — Z8711 Personal history of peptic ulcer disease: Secondary | ICD-10-CM

## 2014-09-03 DIAGNOSIS — K219 Gastro-esophageal reflux disease without esophagitis: Secondary | ICD-10-CM

## 2014-09-03 DIAGNOSIS — I10 Essential (primary) hypertension: Secondary | ICD-10-CM

## 2014-09-03 DIAGNOSIS — L219 Seborrheic dermatitis, unspecified: Secondary | ICD-10-CM

## 2014-09-03 LAB — CBC WITH DIFFERENTIAL/PLATELET
BASOS PCT: 0 % (ref 0–1)
Basophils Absolute: 0 10*3/uL (ref 0.0–0.1)
Eosinophils Absolute: 0.1 10*3/uL (ref 0.0–0.7)
Eosinophils Relative: 2 % (ref 0–5)
HCT: 41.5 % (ref 39.0–52.0)
Hemoglobin: 14 g/dL (ref 13.0–17.0)
LYMPHS ABS: 1.5 10*3/uL (ref 0.7–4.0)
Lymphocytes Relative: 21 % (ref 12–46)
MCH: 30.9 pg (ref 26.0–34.0)
MCHC: 33.7 g/dL (ref 30.0–36.0)
MCV: 91.6 fL (ref 78.0–100.0)
MONO ABS: 0.9 10*3/uL (ref 0.1–1.0)
MPV: 9.5 fL (ref 8.6–12.4)
Monocytes Relative: 12 % (ref 3–12)
NEUTROS PCT: 65 % (ref 43–77)
Neutro Abs: 4.7 10*3/uL (ref 1.7–7.7)
PLATELETS: 276 10*3/uL (ref 150–400)
RBC: 4.53 MIL/uL (ref 4.22–5.81)
RDW: 13.6 % (ref 11.5–15.5)
WBC: 7.3 10*3/uL (ref 4.0–10.5)

## 2014-09-03 LAB — COMPREHENSIVE METABOLIC PANEL
ALBUMIN: 4.3 g/dL (ref 3.5–5.2)
ALK PHOS: 55 U/L (ref 39–117)
ALT: 15 U/L (ref 0–53)
AST: 17 U/L (ref 0–37)
BUN: 17 mg/dL (ref 6–23)
CO2: 29 mEq/L (ref 19–32)
Calcium: 9.8 mg/dL (ref 8.4–10.5)
Chloride: 102 mEq/L (ref 96–112)
Creat: 0.75 mg/dL (ref 0.50–1.35)
GLUCOSE: 85 mg/dL (ref 70–99)
Potassium: 4 mEq/L (ref 3.5–5.3)
SODIUM: 139 meq/L (ref 135–145)
Total Bilirubin: 0.8 mg/dL (ref 0.2–1.2)
Total Protein: 7.1 g/dL (ref 6.0–8.3)

## 2014-09-03 LAB — LIPID PANEL
Cholesterol: 247 mg/dL — ABNORMAL HIGH (ref 0–200)
HDL: 58 mg/dL (ref >39)
LDL Cholesterol: 160 mg/dL — ABNORMAL HIGH (ref 0–99)
Total CHOL/HDL Ratio: 4.3 Ratio
Triglycerides: 145 mg/dL (ref <150)
VLDL: 29 mg/dL (ref 0–40)

## 2014-09-03 MED ORDER — VALSARTAN-HYDROCHLOROTHIAZIDE 160-12.5 MG PO TABS
1.0000 | ORAL_TABLET | Freq: Every day | ORAL | Status: DC
Start: 2014-09-03 — End: 2015-04-18

## 2014-09-03 NOTE — Progress Notes (Signed)
   Subjective:    Patient ID: Manuel [name], male    DOB: [birth year], 74 y.o.   MRN: [id number]  HPI He presents for medication check. He reports having 2 episodes of indigestion weekly and is taking daily pepcid for this. He does have a previous history of ulcer disease. He continues on his Diovan without difficulty and is using triamcinolone and Dovonex for psoriasis. His allergies are under good control .He has chronic right hand weakness and states he is managing ok with this issue. Review his record also indicates atherosclerosis but no evidence of  AAA.PMH and social history were reviewed. He is retired and keeping busy. His marriage is still going quite strongly. Lab work does show evidence of glucose intolerance. He did stop his statin due to myalgias.  Review of Systems  All other systems reviewed and are negative.      Objective:   Physical Exam  alert and in no distress. Tympanic membranes and canals are normal. Throat is clear. Tonsils are normal. Neck is supple without adenopathy or thyromegaly. Cardiac exam shows a regular sinus rhythm without murmurs or gallops. Lungs are clear to auscultation.       Assessment & Plan:  Essential hypertension - Plan: CBC with Differential/Platelet, Comprehensive metabolic panel, valsartan-hydrochlorothiazide (DIOVAN-HCT) 160-12.5 MG per tablet  Impaired glucose tolerance - Plan: Comprehensive metabolic panel  Hyperlipidemia LDL goal <100 - Plan: Lipid panel  Aortic atherosclerosis  Allergic rhinitis due to pollen  Psoriasis  Seborrhea  Right hand weakness  Gastroesophageal reflux disease without esophagitis  History of peptic ulcer disease  Blood pressure controlled on Diovan-HCT.  Suggested that he attempt to reduce pepcid from twice daily to once daily and and see if reflux symptoms can be managed.  He was previously taking Lipitor for hyperlipidemia and reports having myalgias 1.5 years ago. He was advised to stop the  medication at that point and his symptoms resolved. Will await lipid levels and consider statin therapy.  I will do vitamin D to further assess possible relationship to his statin myalgias.

## 2014-09-04 LAB — VITAMIN D 25 HYDROXY (VIT D DEFICIENCY, FRACTURES): Vit D, 25-Hydroxy: 15 ng/mL — ABNORMAL LOW (ref 30–100)

## 2014-09-04 MED ORDER — VITAMIN D (ERGOCALCIFEROL) 1.25 MG (50000 UNIT) PO CAPS: 50000.0000 [IU] | ORAL_CAPSULE | ORAL | Status: DC

## 2014-09-04 NOTE — Addendum Note (Signed)
Addended by: Denita Lung on: 09/04/2014 08:20 AM   Modules accepted: Orders

## 2014-09-04 NOTE — Progress Notes (Signed)
   Subjective:    Patient ID: Manuel [name], male    DOB: 1941-04-08, 74 y.o.   MRN: [id number]  HPI    Review of Systems     Objective:   Physical Exam        Assessment & Plan:  Blood work shows vitamin D deficiency. I will treat him with 50,000 international units weekly for 8 weeks and recheck this.

## 2014-09-24 ENCOUNTER — Ambulatory Visit: Payer: Medicare Other | Admitting: Internal Medicine

## 2014-10-20 ENCOUNTER — Telehealth: Payer: Self-pay | Admitting: Family Medicine

## 2014-10-21 ENCOUNTER — Telehealth: Payer: Self-pay | Admitting: Family Medicine

## 2014-10-21 NOTE — Telephone Encounter (Signed)
Recv'd response back from P.A. States this medication is on pt's list of covered drugs and no P.A. needed

## 2014-10-21 NOTE — Telephone Encounter (Signed)
lm

## 2014-10-30 IMAGING — RF DG UGI W/ HIGH DENSITY W/KUB
18 of 20 series · 18 of 20 positions shown · non-contrast
Comparison: Ultrasound 05/21/2013

FLUOROSCOPY TIME:  One. 54 seconds.

CLINICAL DATA: Abdominal pain.

EXAM:
UPPER GI SERIES W/HIGH DENSITY W/KUB
TECHNIQUE: After obtaining a scout radiograph a routine upper GI series was
performed using thin and high density barium.

[Series 1: run · 1 of 1 slices shown (1 of 17)]
[im 1/1]
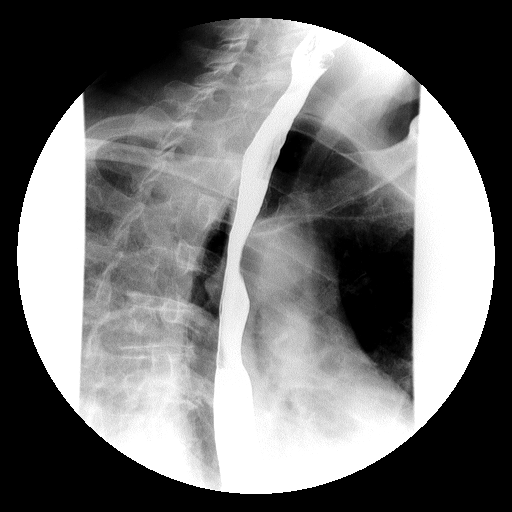

[Series 2: run · 1 of 1 slices shown (2 of 17)]
[im 1/1]
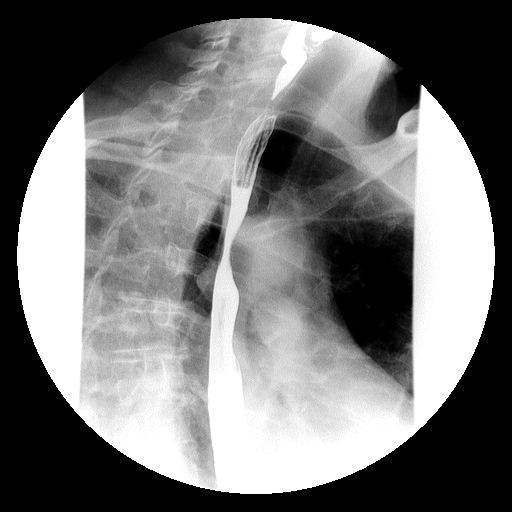

[Series 3: run · 1 of 1 slices shown (3 of 17)]
[im 1/1]
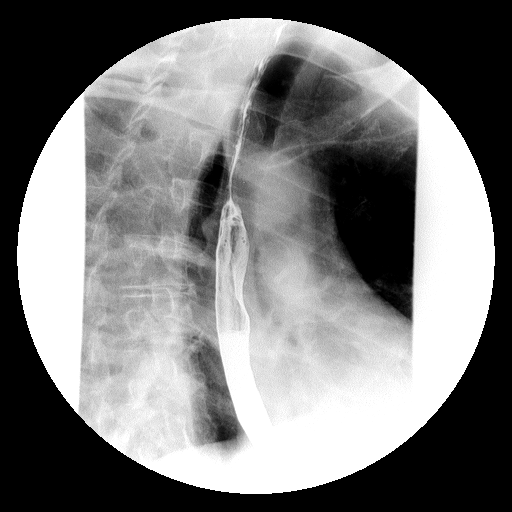

[Series 4: run · 1 of 1 slices shown (4 of 17)]
[im 1/1]
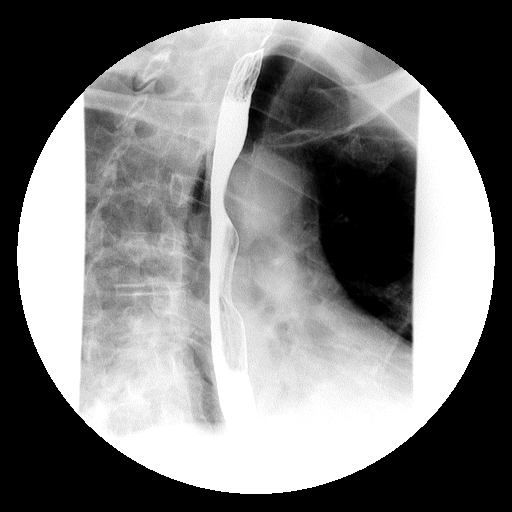

[Series 6: run · 1 of 1 slices shown (5 of 17)]
[im 1/1]
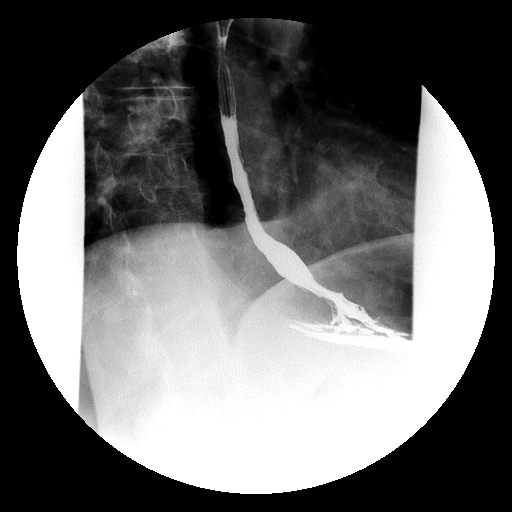

[Series 7: run · 1 of 1 slices shown (6 of 17)]
[im 1/1]
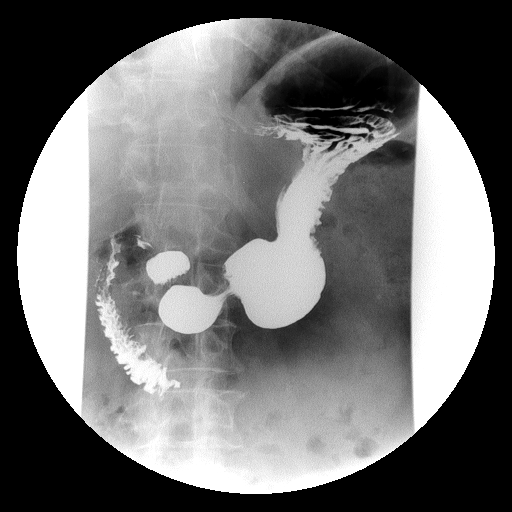

[Series 8: run · 1 of 1 slices shown (7 of 17)]
[im 1/1]
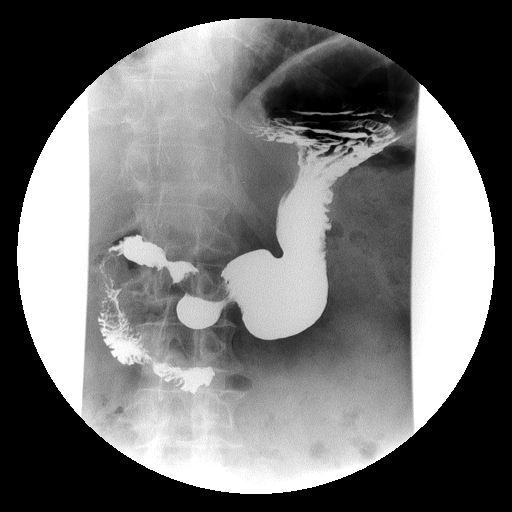

[Series 9: run · 1 of 1 slices shown (8 of 17)]
[im 1/1]
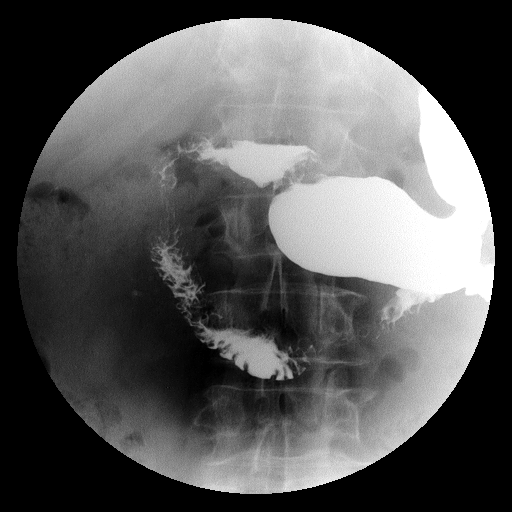

[Series 10: run · 1 of 1 slices shown (9 of 17)]
[im 1/1]
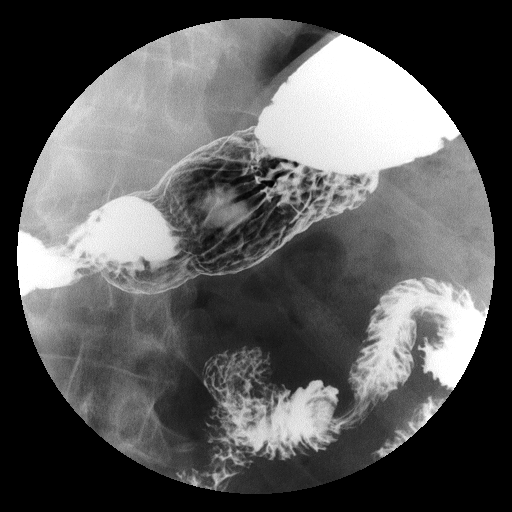

[Series 11: run · 1 of 1 slices shown (10 of 17)]
[im 1/1]
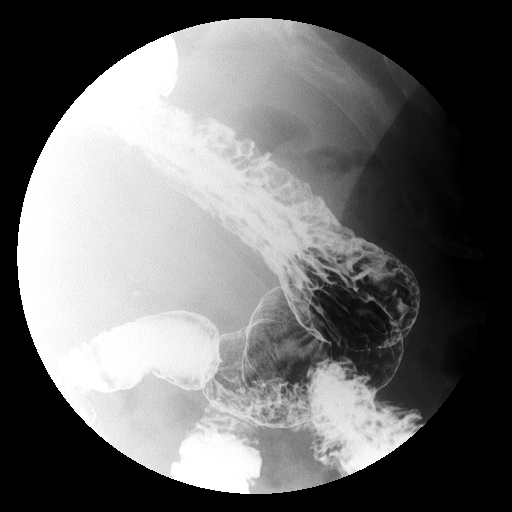

[Series 12: run · 1 of 1 slices shown (11 of 17)]
[im 1/1]
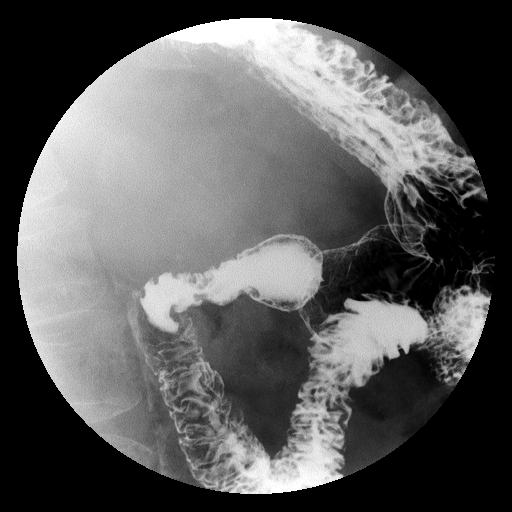

[Series 13: run · 1 of 1 slices shown (12 of 17)]
[im 1/1]
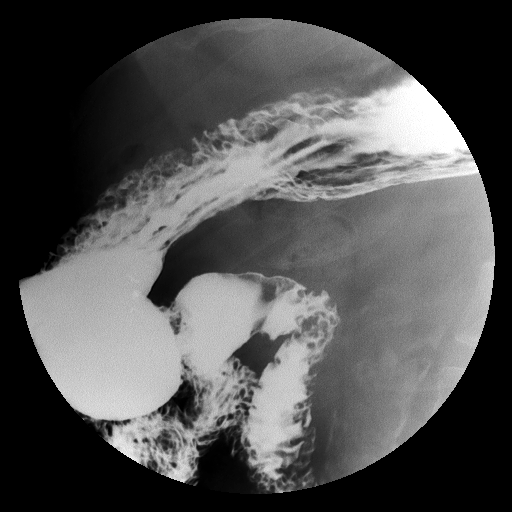

[Series 14: run · 1 of 1 slices shown (13 of 17)]
[im 1/1]
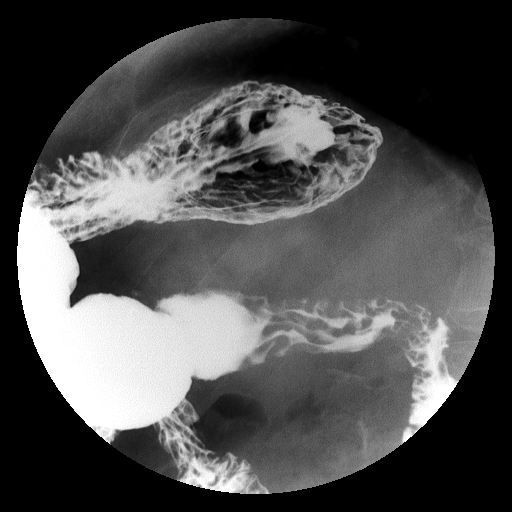

[Series 16: run · 1 of 1 slices shown (14 of 17)]
[im 1/1]
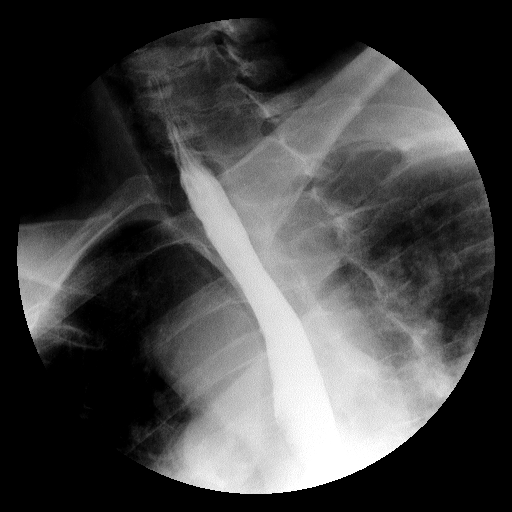

[Series 17: run · 1 of 1 slices shown (15 of 17)]
[im 1/1]
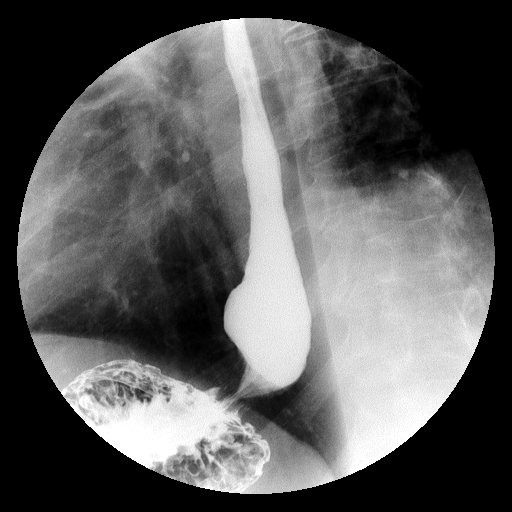

[Series 18: run · 1 of 1 slices shown (16 of 17)]
[im 1/1]
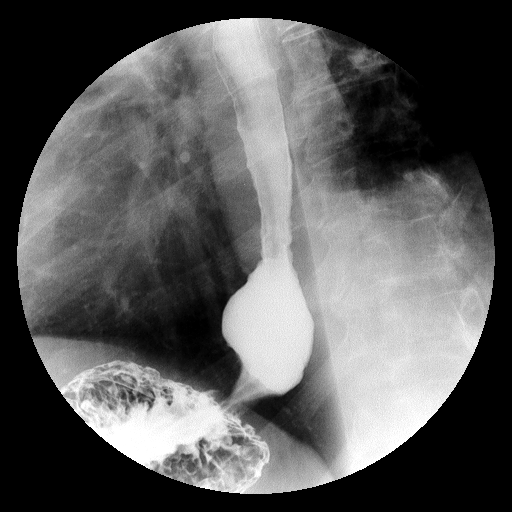

[Series 19: run · 1 of 1 slices shown (17 of 17)]
[im 1/1]
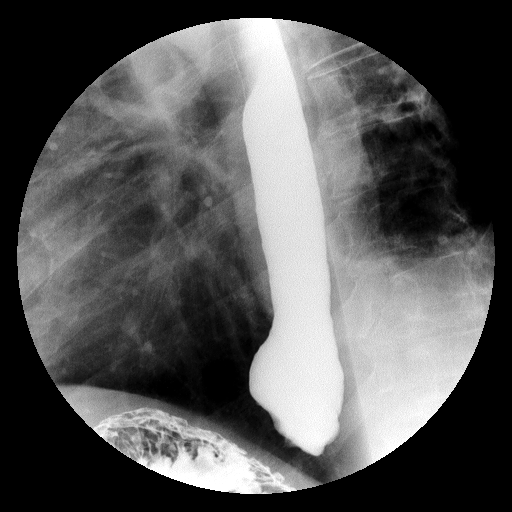

[Series 1001: view not recorded · 0.20mm/px · 1 of 1 slices shown]
[im 1/1]
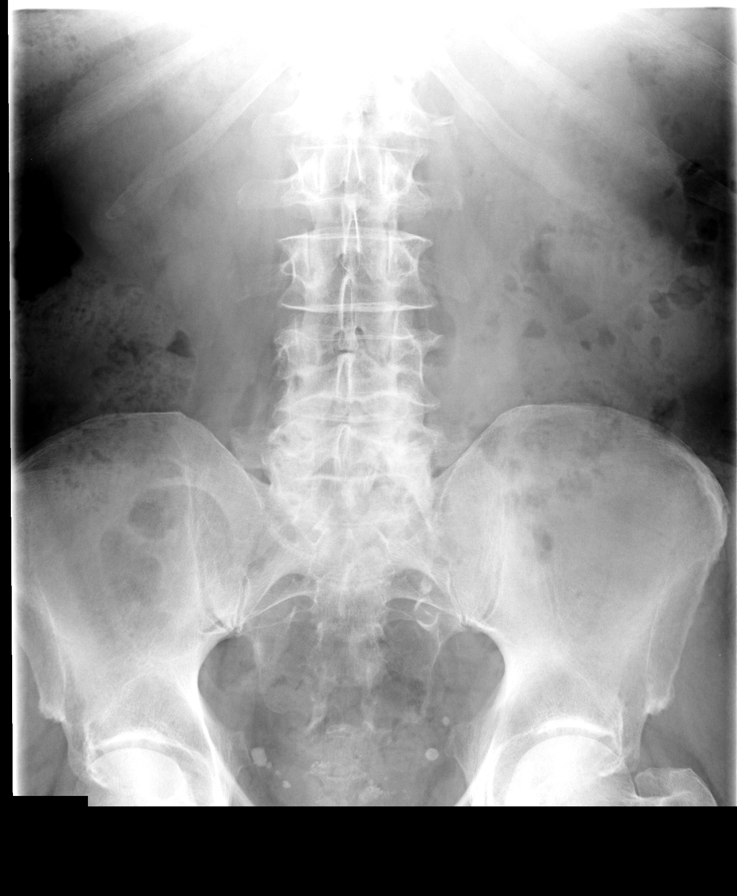

[18 of 20 positions shown; findings below may reference images not displayed]

FINDINGS: Esophageal and gastric mucosal pattern and peristaltic activity are
normal. Gastric antrum distends normally. The duodenum and C-loop
are normal. Degenerative changes noted of the lumbar spine and both
hips. Vascular calcifications are noted in the pelvis consistent
with phleboliths. Aortoiliac atherosclerotic vascular disease
present.
IMPRESSION: Normal upper GI.

## 2014-11-14 ENCOUNTER — Ambulatory Visit (INDEPENDENT_AMBULATORY_CARE_PROVIDER_SITE_OTHER): Payer: Medicare Other | Admitting: Family Medicine

## 2014-11-14 ENCOUNTER — Encounter: Payer: Self-pay | Admitting: Family Medicine

## 2014-11-14 VITALS — BP 120/70 | HR 88 | Wt 193.0 lb

## 2014-11-14 DIAGNOSIS — R1011 Right upper quadrant pain: Secondary | ICD-10-CM

## 2014-11-14 DIAGNOSIS — Z8711 Personal history of peptic ulcer disease: Secondary | ICD-10-CM | POA: Diagnosis not present

## 2014-11-14 NOTE — Patient Instructions (Signed)
Will stick with that twice a day dosing of the famotidine. Pay attention to see if greasy foods play a role here. Let me know

## 2014-11-14 NOTE — Progress Notes (Signed)
   Subjective:    Patient ID: Manuel [name], male    DOB: Jan 25, 1941, 74 y.o.   MRN: [id number]  HPI He is here for evaluation of continued difficulty with "sour stomach". He notes that this occurs within 20 minutes after eating. He usually points to the right upper quadrant. He does have some bloating and eructation with this but no nausea, vomiting or diarrhea. He has had a previous GI evaluation including endoscopy, ultrasound and HIDA scanning all of which was negative except for erosive esophagitis. There is apparently a previous history of ulcer disease.He was placed on famotidine twice a day dosing which worked quite well until recently. He did stop and then has started the dosing again going to twice a day. He thinks that it helps. He is not sure whether greasy foods play a role in this. He does not complain of early satiety, acid indigestion type symptoms, coughing.   Review of Systems     Objective:   Physical Exam Alert and in no distress. Throat is clear. Neck is supple without adenopathy. Cardiac and lung exam normal. Abdominal exam shows active bowel sounds with slight right upper quadrant tenderness. Murphy's sign is negative.       Assessment & Plan:  History of peptic ulcer disease  RUQ abdominal pain he will continue on his twice a day dosing of famotidine. He is to pay attention to foods especially greasy foods that might make this worse. Consider switching him to a PPI and possibly reevaluating his gallbladder depending upon his history

## 2015-02-04 ENCOUNTER — Encounter: Payer: Self-pay | Admitting: Family Medicine

## 2015-02-04 ENCOUNTER — Ambulatory Visit (INDEPENDENT_AMBULATORY_CARE_PROVIDER_SITE_OTHER): Payer: Medicare Other | Admitting: Family Medicine

## 2015-02-04 VITALS — BP 114/70 | HR 64 | Wt 193.6 lb

## 2015-02-04 DIAGNOSIS — K219 Gastro-esophageal reflux disease without esophagitis: Secondary | ICD-10-CM

## 2015-02-04 DIAGNOSIS — Z8711 Personal history of peptic ulcer disease: Secondary | ICD-10-CM

## 2015-02-04 DIAGNOSIS — I10 Essential (primary) hypertension: Secondary | ICD-10-CM

## 2015-02-04 NOTE — Patient Instructions (Signed)
Stay on the famotidine at home much you use is negotiable. If you have to take 2 twice a day and that's okay

## 2015-02-04 NOTE — Progress Notes (Signed)
   Subjective:    Patient ID: Manuel [name], male    DOB: 01-29-1941, 74 y.o.   MRN: [id number]  HPI He is here for follow-up. He does have a long history of difficulty with stomach trouble. He has a previous history of ulcer disease. Presently he is doing well on his present regimen of famotidine. He takes up to 20 mg twice a day of this medication and does his symptoms.He also is seeing some relief with the use of yogurt.He does complain of someurinary urgency but no frequency,hesitancy,decreased stream or incomplete emptying.  Review of Systems     Objective:   Physical Exam Alert and in no distress otherwise not examined       Assessment & Plan:  History of peptic ulcer disease  Essential hypertension  Gastroesophageal reflux disease without esophagitis He is to continue on his present regimen of using this more or less on an as-needed basis and is getting good results. Also discussed his urinary symptoms and at this point he is not interested in pursuing this any further.

## 2015-02-06 ENCOUNTER — Encounter: Payer: Self-pay | Admitting: Internal Medicine

## 2015-02-08 ENCOUNTER — Other Ambulatory Visit: Payer: Self-pay | Admitting: Internal Medicine

## 2015-02-19 ENCOUNTER — Encounter: Payer: Self-pay | Admitting: Family Medicine

## 2015-02-19 ENCOUNTER — Ambulatory Visit (INDEPENDENT_AMBULATORY_CARE_PROVIDER_SITE_OTHER): Payer: Medicare Other | Admitting: Family Medicine

## 2015-02-19 VITALS — BP 120/76 | HR 88 | Temp 98.7°F | Ht 70.5 in | Wt 194.0 lb

## 2015-02-19 DIAGNOSIS — L409 Psoriasis, unspecified: Secondary | ICD-10-CM

## 2015-02-19 DIAGNOSIS — H918X2 Other specified hearing loss, left ear: Secondary | ICD-10-CM

## 2015-02-19 DIAGNOSIS — L219 Seborrheic dermatitis, unspecified: Secondary | ICD-10-CM | POA: Diagnosis not present

## 2015-02-19 DIAGNOSIS — H6122 Impacted cerumen, left ear: Secondary | ICD-10-CM

## 2015-02-19 NOTE — Progress Notes (Signed)
Chief Complaint  Patient presents with  . Ear Fullness    left ear is clogged, he believes. Feels like he cannot hear out of it. Also brought in two different creams from derm and wanted to know if he could use either one of these for 2 places on the top of his head-which one, if any, would be best.    About once a year his ears get plugged with wax and he needs to get it treated.  He woke up today and felt some waxy stuff on the left.   He feels like the left ear is plugged, with decreased hearing.  Denies any pain. Denies any significant ringing in the ear.   He wasn't up to trying to flush the ear out today (has done in the past).  Denies fevers or chills.  He has had some mild allergies/congestion over the last couple of months.  He brought in ketoconazole and calcipotriene creams from the dermatologist. He isn't sure which cream is for which problem.  He has rash around his nose and eyebrows, and also has some dry, flaking at his ears. He also has psoriasis--elbows, ankles.  PMH, PSH, SH reviewed Outpatient Encounter Prescriptions as of 02/19/2015  Medication Sig  . Cholecalciferol (VITAMIN D) 2000 UNITS CAPS Take 1 capsule by mouth daily.   . famotidine (PEPCID) 20 MG tablet TAKE 1 TABLET BY MOUTH TWICE A DAY  . valsartan-hydrochlorothiazide (DIOVAN-HCT) 160-12.5 MG per tablet Take 1 tablet by mouth daily.  . calcipotriene (DOVONEX) 0.005 % cream Apply 1 application topically See admin instructions. During week days  . ketoconazole (NIZORAL) 2 % cream APPLICATIONS APPLY ON THE SKIN USE ON FACE AND EARS ONCE DAILY  . triamcinolone cream (KENALOG) 0.1 % Apply 1 application topically See admin instructions. Apply once a day on weekends   No facility-administered encounter medications on file as of 02/19/2015.   Allergies  Allergen Reactions  . Ace Inhibitors Cough    Lisinopril    ROS:  No fever, chills, sinus pain, headaches, dizziness, cough, shortness of breath, ear pain, sore  throat.  +mild congestion/allergies.  +decreased hearing on left. +skin rashes--psoriasis on elbows.  PHYSICAL EXAM: BP 120/76 mmHg  Pulse 88  Temp(Src) 98.7 F (37.1 C) (Tympanic)  Ht 5' 10.5" (1.791 m)  Wt 194 lb (87.998 kg)  BMI 27.43 kg/m2  Well developed, pleasant, elderly male in no distress HEENT: PERRL, EOMI, conjunctiva clear.  Left TM is obscured by cerumen.  S/p lavage, there is mild erythema of the canal.  TM is normal.  No edema or exudate in EAC.  Suspect erythema was related to trauma of lavage, rather than infection. Right TM and EAC are completely normal.  OP clear.  Nasal mucosa normal, sinuses nontender Neck: no lymphadenopathy or mass Skin: psoriatic plaques on elbows. No significant rash noted on face Psych: normal mood, affect, hygiene and grooming Neuro: alert and oriented, normal gait.  ASSESSMENT/PLAN:  Hearing loss of left ear due to cerumen impaction - resolved after ear lavage; symptoms completely resolved  Seborrhea  Psoriasis   L cerumen impaction; treated with lavage.  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

## 2015-03-31 ENCOUNTER — Encounter: Payer: Self-pay | Admitting: *Deleted

## 2015-04-18 ENCOUNTER — Ambulatory Visit (INDEPENDENT_AMBULATORY_CARE_PROVIDER_SITE_OTHER): Payer: Medicare Other | Admitting: Family Medicine

## 2015-04-18 ENCOUNTER — Encounter: Payer: Self-pay | Admitting: Family Medicine

## 2015-04-18 VITALS — BP 120/80 | HR 87 | Ht 71.0 in | Wt 192.8 lb

## 2015-04-18 DIAGNOSIS — R7302 Impaired glucose tolerance (oral): Secondary | ICD-10-CM

## 2015-04-18 DIAGNOSIS — E559 Vitamin D deficiency, unspecified: Secondary | ICD-10-CM | POA: Diagnosis not present

## 2015-04-18 DIAGNOSIS — M6289 Other specified disorders of muscle: Secondary | ICD-10-CM

## 2015-04-18 DIAGNOSIS — I10 Essential (primary) hypertension: Secondary | ICD-10-CM | POA: Diagnosis not present

## 2015-04-18 DIAGNOSIS — Z0000 Encounter for general adult medical examination without abnormal findings: Secondary | ICD-10-CM | POA: Diagnosis not present

## 2015-04-18 DIAGNOSIS — G4733 Obstructive sleep apnea (adult) (pediatric): Secondary | ICD-10-CM | POA: Diagnosis not present

## 2015-04-18 DIAGNOSIS — E785 Hyperlipidemia, unspecified: Secondary | ICD-10-CM | POA: Diagnosis not present

## 2015-04-18 DIAGNOSIS — J301 Allergic rhinitis due to pollen: Secondary | ICD-10-CM

## 2015-04-18 DIAGNOSIS — Z125 Encounter for screening for malignant neoplasm of prostate: Secondary | ICD-10-CM

## 2015-04-18 DIAGNOSIS — L409 Psoriasis, unspecified: Secondary | ICD-10-CM

## 2015-04-18 DIAGNOSIS — K219 Gastro-esophageal reflux disease without esophagitis: Secondary | ICD-10-CM | POA: Diagnosis not present

## 2015-04-18 DIAGNOSIS — R29898 Other symptoms and signs involving the musculoskeletal system: Secondary | ICD-10-CM

## 2015-04-18 MED ORDER — VALSARTAN-HYDROCHLOROTHIAZIDE 160-12.5 MG PO TABS: 1.0000 | ORAL_TABLET | Freq: Every day | ORAL | Status: DC

## 2015-04-18 MED ORDER — ATORVASTATIN CALCIUM 10 MG PO TABS: 10.0000 mg | ORAL_TABLET | Freq: Every day | ORAL | Status: DC

## 2015-04-18 NOTE — Progress Notes (Signed)
Subjective:    Patient ID: Manuel [name], male    DOB: 11-Aug-1940, 74 y.o.   MRN: [id number]  HPI He is here for complete examination. He does have hypertension and in 10 use on valsartan/HCTZ. He is having no difficulty with that. He does have underlying OSAAnd has seen Dr. Gwenette Greet in the past for this. He does have a history of hyperlipidemia and apparently was on a statin however when it was increased he had some myalgias. Presently he is on no medication. His allergies are under good control. His psoriasis gives him very little difficulty. He does have a previous history of impaired fasting glucose.He has a history of psoriasis but this causes very little difficulty. He has a history of reflux disease and is doing well on Pepcid.Also review his record indicates he has a vitamin D deficiency and was given 50,000 international units of vitamin D. He has not had a follow-up blood study. Also in the past he had seen urology for apparently a prostate nodule and was seen twice there. The evaluation was negative. Family and social history as well as health maintenance and immunizations were reviewed. He does have an advanced directive. He is doing quite well psychologically and his marriage is quite strong. They have been married for over 72 years. Review his record indicates that the right hand weakness apparently started after having a bout of meningitis. This was in 1973.   Review of Systems  All other systems reviewed and are negative.      Objective:   Physical Exam BP 120/80 mmHg  Pulse 87  Ht 5\' 11"  (1.803 m)  Wt 192 lb 12.8 oz (87.454 kg)  BMI 26.90 kg/m2  SpO2 98%  General Appearance:    Alert, cooperative, no distress, appears stated age  Head:    Normocephalic, without obvious abnormality, atraumatic  Eyes:    PERRL, conjunctiva/corneas clear, EOM's intact, fundi    benign  Ears:    Normal TM's and external ear canals  Nose:   Nares normal, mucosa normal, no drainage or sinus    tenderness  Throat:   Lips, mucosa, and tongue normal; teeth and gums normal  Neck:   Supple, no lymphadenopathy;  thyroid:  no   enlargement/tenderness/nodules; no carotid   bruit or JVD  Back:    Spine nontender, no curvature, ROM normal, no CVA     tenderness  Lungs:     Clear to auscultation bilaterally without wheezes, rales or     ronchi; respirations unlabored  Chest Wall:    No tenderness or deformity   Heart:    Regular rate and rhythm, S1 and S2 normal, no murmur, rub   or gallop  Breast Exam:    No chest wall tenderness, masses or gynecomastia  Abdomen:     Soft, non-tender, nondistended, normoactive bowel sounds,    no masses, no hepatosplenomegaly        Extremities:   No clubbing, cyanosis or edema  Pulses:   2+ and symmetric all extremities  Skin:   Skin color, texture, turgor normal, no rashes or lesions  Lymph nodes:   Cervical, supraclavicular, and axillary nodes normal  Neurologic:   CNII-XII intact, normal strength, sensation and gait; reflexes 2+ and symmetric throughout          Psych:   Normal mood, affect, hygiene and grooming.          Assessment & Plan:  Routine general medical examination at a health care facility  Hyperlipidemia LDL goal <100 - Plan: atorvastatin (LIPITOR) 10 MG tablet  Obstructive sleep apnea  Essential hypertension  Allergic rhinitis due to pollen  Psoriasis  Impaired glucose tolerance  Gastroesophageal reflux disease without esophagitis  Right hand weakness  Vitamin D deficiency - Plan: Vit D  25 hydroxy (rtn osteoporosis monitoring) Overall he seems to be taking fairly good care of himself. I will renew his medications. Follow-up on the vitamin D . His last blood work was in February. Place him back on Lipitor at a lower dose, 1 he seemed to tolerate fairly well. Over 45 minutes, greater than 50% spent in counseling and coordination of care.

## 2015-04-18 NOTE — Addendum Note (Signed)
Addended by: Denita Lung on: 04/18/2015 01:24 PM   Modules accepted: Level of Service

## 2015-04-19 LAB — PSA: PSA: 3.93 ng/mL (ref <=4.00)

## 2015-04-19 LAB — VITAMIN D 25 HYDROXY (VIT D DEFICIENCY, FRACTURES): VIT D 25 HYDROXY: 15 ng/mL — AB (ref 30–100)

## 2015-04-21 ENCOUNTER — Other Ambulatory Visit: Payer: Self-pay

## 2015-04-21 DIAGNOSIS — R7989 Other specified abnormal findings of blood chemistry: Secondary | ICD-10-CM

## 2015-04-21 MED ORDER — VITAMIN D (ERGOCALCIFEROL) 1.25 MG (50000 UNIT) PO CAPS: 50000.0000 [IU] | ORAL_CAPSULE | ORAL | Status: DC

## 2015-05-07 ENCOUNTER — Encounter: Payer: Self-pay | Admitting: Internal Medicine

## 2015-05-07 ENCOUNTER — Ambulatory Visit (INDEPENDENT_AMBULATORY_CARE_PROVIDER_SITE_OTHER): Payer: Medicare Other | Admitting: Internal Medicine

## 2015-05-07 VITALS — BP 116/80 | HR 92 | Ht 70.0 in | Wt 195.1 lb

## 2015-05-07 DIAGNOSIS — R1013 Epigastric pain: Secondary | ICD-10-CM | POA: Diagnosis not present

## 2015-05-07 DIAGNOSIS — R140 Abdominal distension (gaseous): Secondary | ICD-10-CM | POA: Diagnosis not present

## 2015-05-07 DIAGNOSIS — R1011 Right upper quadrant pain: Secondary | ICD-10-CM | POA: Diagnosis not present

## 2015-05-07 MED ORDER — FAMOTIDINE 20 MG PO TABS: 20.0000 mg | ORAL_TABLET | Freq: Two times a day (BID) | ORAL | Status: DC

## 2015-05-07 NOTE — Progress Notes (Signed)
Subjective:    Patient ID: Manuel [name], male    DOB: 03-20-1941, 74 y.o.   MRN: [id number]  HPI Manuel [name] is a 74 year old male with history of erosive gastritis, GERD, nonspecific neuromuscular disorder, hypertension and hyperlipidemia is here for follow-up. He was last seen in August 2015. He has been maintained on famotidine 20 mg twice daily for GERD and dyspeptic symptoms. He has made dietary changes including avoiding heavy and fatty foods. He feels that this has helped. Not having much heartburn. He is having occasional right upper quadrant discomfort which can be bothersome and associated with nausea. Seems to be triggered by eating. No vomiting. Appetite is good. Weight is stable and up 5 pounds in the past years by our scales. Stools occasionally loose but for the most part formed and regular. No significant diarrhea or constipation. No blood in his stool or melena.  He is now seeing Dr. Redmond School for primary care.    His wife has Parkinson's but has not had trouble with recurrent C. difficile after FMT in [place] As per HPI, otherwise negative  Current Medications, Allergies, Past Medical History, Past Surgical History, Family History and Social History were reviewed in [employer] record.      Objective:   Physical Exam BP 116/80 mmHg  Pulse 92  Ht 5\' 10"  (1.778 m)  Wt 195 lb 2 oz (88.508 kg)  BMI 28.00 kg/m2 Constitutional: Well-developed and well-nourished. No distress. HEENT: Normocephalic and atraumatic. Oropharynx is clear and moist. No oropharyngeal exudate. Conjunctivae are normal.  No scleral icterus. Neck: Neck supple. Trachea midline. Cardiovascular: Normal rate, regular rhythm and intact distal pulses. No M/R/G Pulmonary/chest: Effort normal and breath sounds normal.  Abdominal: Soft, nontender, nondistended. Bowel sounds active throughout.  Extremities: no clubbing, cyanosis, or edema Lymphadenopathy: No cervical  adenopathy noted. Neurological: Alert and oriented to person place and time. Skin: Skin is warm and dry. No rashes noted. Psychiatric: Normal mood and affect. Behavior is normal.  CBC    Component Value Date/Time   WBC 7.3 09/03/2014 1135   RBC 4.53 09/03/2014 1135   HGB 14.0 09/03/2014 1135   HCT 41.5 09/03/2014 1135   PLT 276 09/03/2014 1135   MCV 91.6 09/03/2014 1135   MCH 30.9 09/03/2014 1135   MCHC 33.7 09/03/2014 1135   RDW 13.6 09/03/2014 1135   LYMPHSABS 1.5 09/03/2014 1135   MONOABS 0.9 09/03/2014 1135   EOSABS 0.1 09/03/2014 1135   BASOSABS 0.0 09/03/2014 1135    CMP     Component Value Date/Time   NA 139 09/03/2014 1135   K 4.0 09/03/2014 1135   CL 102 09/03/2014 1135   CO2 29 09/03/2014 1135   GLUCOSE 85 09/03/2014 1135   BUN 17 09/03/2014 1135   CREATININE 0.75 09/03/2014 1135   CREATININE 0.82 07/30/2013 1832   CALCIUM 9.8 09/03/2014 1135   PROT 7.1 09/03/2014 1135   ALBUMIN 4.3 09/03/2014 1135   AST 17 09/03/2014 1135   ALT 15 09/03/2014 1135   ALKPHOS 55 09/03/2014 1135   BILITOT 0.8 09/03/2014 1135   GFRNONAA 86* 07/30/2013 1832   GFRAA >90 07/30/2013 1832    EGD - Jan 2015 -- gastritis, benign proximal stomach polyps. Small angiodysplastic lesion in D2.  Biopsies rather unremarkable. Colonoscopy 2010 -- no polyps repeat 2020  HIDA scanCLINICAL DATA:  Epigastric pain, right upper quadrant pain   EXAM: NUCLEAR MEDICINE HEPATOBILIARY IMAGING WITH GALLBLADDER EF   TECHNIQUE: Sequential images of  the abdomen were obtained out to 60 minutes following intravenous administration of radiopharmaceutical. After oral ingestion of Ensure, gallbladder ejection fraction was determined. At 60 min, normal ejection fraction is greater than 33%.   COMPARISON:  None.   RADIOPHARMACEUTICALS:  5.5 mCi Tc-1m Choletec   FINDINGS: There is immediate homogeneous uptake of radiotracer in the liver. Filling of the gallbladder begins at 15 minutes. Radiotracer  uptake is present in the small bowel at 20 minutes.   At the end of 1-hour, there is relatively good clearance of activity from the liver.   When gallbladder filling was complete, the patient was given PO Ensure.   Gallbladder ejection fraction: 38.7%. Normal gallbladder ejection fraction with Ensure is greater than 33%. The patient did not experience symptoms after oral ingestion of Ensure.   IMPRESSION: 1. Normal hepatobiliary scan. 2. The gallbladder ejection fraction measures 38.7% which is at the lower end of normal.     Electronically Signed   By: Kathreen Devoid   On: 08/16/2013 16:22       Assessment & Plan:  74 year old male with history of erosive gastritis, GERD, nonspecific neuromuscular disorder, hypertension and hyperlipidemia is here for follow-up.  1. RUQ pain/dyspepsia/occ nausea -- symptoms are not severe enough now for him to limit activity or desire further workup. I have suspicion for possible biliary dyskinesia given the right upper quadrant abdominal pain and nausea which is triggered by eating. Gallbladder function was low end of normal in January 2015. If symptoms worsen he is asked to call me and I would recommend [facility] HIDA scan. I want him to continue famotidine 20 mg twice daily. He will also try over-the-counter FDgard 1-2 capsules before meals for functional dyspepsia.  2. CRC screening -- repeat screening exam to be considered in 2020  Over 25 minutes spent with the patient today

## 2015-05-07 NOTE — Patient Instructions (Signed)
We have sent the following medications to your pharmacy for you to pick up at your convenience: Pepcid twice daily  Please purchase the following medications over the counter and take as directed: FD Guard-Take as per box instructions  Call our office should your abdominal pain worsen or if you have trouble eating.  Follow up with Dr Hilarie Fredrickson in 1 year.

## 2015-05-09 ENCOUNTER — Other Ambulatory Visit: Payer: Self-pay | Admitting: Family Medicine

## 2015-05-19 ENCOUNTER — Encounter: Payer: Self-pay | Admitting: Family Medicine

## 2015-05-19 ENCOUNTER — Ambulatory Visit (INDEPENDENT_AMBULATORY_CARE_PROVIDER_SITE_OTHER): Payer: Medicare Other | Admitting: Family Medicine

## 2015-05-19 VITALS — BP 122/80 | HR 68 | Wt 193.8 lb

## 2015-05-19 DIAGNOSIS — T148XXA Other injury of unspecified body region, initial encounter: Secondary | ICD-10-CM

## 2015-05-19 DIAGNOSIS — T148 Other injury of unspecified body region: Secondary | ICD-10-CM | POA: Diagnosis not present

## 2015-05-19 NOTE — Progress Notes (Signed)
   Subjective:    Patient ID: Manuel [name], male    DOB: Dec 07, 1940, 74 y.o.   MRN: [id number]  HPI He is here for a 4 day history of blood blister to the side of his left hand after banging it on the lawn mower. Denies pain, numbness or tingling to left hand.   Reviewed allergies, medications, past medical history and immunization history.    Review of Systems Pertinent positives and negatives in the history of present illness    Objective:   Physical Exam  2cm x 1xm blood blister to ulnar side of left hand. Hand exam otherwise normal with good sensation, cap refill, pulses, strength. Non tender.       Assessment & Plan:  Blood blister  Discussion on whether to do watchful waiting versus drain the blister. Patient preferred to have blister drained. Verbal consent obtained and Dr. Redmond School and I drained blister using a 21 gauge needle. Large band aid placed over area and instructions given to keep area clean and dry and covered. Patient tolerated procedure well. He is up to date of Tetanus.

## 2015-05-19 NOTE — Patient Instructions (Signed)
Blisters °A blister is a fluid-filled sac that forms between layers of skin. Blisters often form in areas where skin rubs against other skin or rubs against something else. The most common areas for blisters are the hands and feet. °CAUSES °A blister can be caused by: °· An injury. °· A burn. °· An allergic reaction. °· An infection. °· Exposure to irritating chemicals. °· Friction. °Friction blisters often result from: °· Sports. °· Repetitive activities. °· Shoes that are too tight or too loose. °SIGNS AND SYMPTOMS °A blister is often round and looks like a bump. It may itch or be painful to the touch. The liquid in a blister is clear or bloody. Before a blister forms, the skin may become red, feel warm, itch, or be painful to the touch. °DIAGNOSIS °A blister can usually be diagnosed from its appearance. °TREATMENT °Treatment involves protecting the area where the blister has formed until the skin has healed. If something is likely to rub against the blister, apply a bandage (dressing) with a hole in the middle over the blister. °Most blisters break open, dry up, and go away on their own within 10 days. Rarely, blisters that are very painful may be drained before they break open on their own. Draining of a blister should only be done by a health care provider under sterile conditions. °HOME CARE INSTRUCTIONS °· Protect the area where the blister has formed as directed by your health care provider. °· Do not open or pop your blister, because it could become infected. °· If the blister is very painful, ask your health care provider whether you should have it drained. °· If the blister breaks open on its own: °¨ Do not remove the loose skin that is over the blister. °¨ Wash the blister area with soap and water every day. °¨ After washing the blister area, you may apply an antibiotic cream or ointment and cover the area with a bandage. °PREVENTION °Taking these steps can help to prevent blisters that are caused by  friction: °· Wear comfortable shoes that fit well. °· Always wear socks with shoes. °· Wear extra socks or use tape, bandages, or pads over blister-prone areas as needed. °· Wear protective gear, such as gloves, when participating in sports or activities that can cause blisters. °· Use powders as needed to keep your feet dry. °SEEK MEDICAL CARE IF: °· You have increased redness, swelling, or pain in the blister area. °· A puslike discharge is coming from the blister area. °· You have a fever. °· You have chills. °  °This information is not intended to replace advice given to you by your health care provider. Make sure you discuss any questions you have with your health care provider. °  °Document Released: 08/26/2004 Document Revised: 08/09/2014 Document Reviewed: 02/16/2014 °Elsevier Interactive Patient Education ©2016 Elsevier Inc. ° °

## 2015-05-23 ENCOUNTER — Encounter: Payer: Self-pay | Admitting: Family Medicine

## 2015-05-23 ENCOUNTER — Ambulatory Visit (INDEPENDENT_AMBULATORY_CARE_PROVIDER_SITE_OTHER): Payer: Medicare Other | Admitting: Family Medicine

## 2015-05-23 VITALS — BP 138/82 | HR 64 | Wt 192.8 lb

## 2015-05-23 DIAGNOSIS — T148XXA Other injury of unspecified body region, initial encounter: Secondary | ICD-10-CM

## 2015-05-23 DIAGNOSIS — T148 Other injury of unspecified body region: Secondary | ICD-10-CM | POA: Diagnosis not present

## 2015-05-23 MED ORDER — DOXYCYCLINE HYCLATE 100 MG PO TABS: 100.0000 mg | ORAL_TABLET | Freq: Two times a day (BID) | ORAL | Status: DC

## 2015-05-23 NOTE — Progress Notes (Signed)
   Subjective:    Patient ID: Manuel [name], male    DOB: Aug 23, 1940, 74 y.o.   MRN: [id number]  HPI He is here for follow up visit for a blood blister to his left hand that was drained on 10/17 and has since filled back up with blood. He now has some redness to the skin surrounding and some mild soreness. Denies fever, chills.    Review of Systems Pertinent positives and negatives in the history of present illness.    Objective:   Physical Exam  Alert and in no distress. 1.5 cm x 1 cm blood blister to hypothenar region of left hand with surrounding erythema, skin otherwise normal. Good sensation, cap refill <2 secs, normal ROM and grip strength.      Assessment & Plan:  Blood blister  Verbal consent was obtained prior to procedure. Area was cleaned with alcohol and an 18 gauge needle was used to puncture the blister. Small amount of blood drained. No pus. A small thin layer of bacitracin was applied. A nonadherent dressing covered with sterile gauze applied to area and coband wrapped around to apply pressure. Good sensation, color and cap refill normal following bandage application. Instructions on how to keep the area clean and covered given. Prescription for doxycycline was sent to pharmacy.  Patient tolerated procedure well

## 2015-06-06 ENCOUNTER — Other Ambulatory Visit: Payer: Medicare Other

## 2015-06-06 DIAGNOSIS — R7989 Other specified abnormal findings of blood chemistry: Secondary | ICD-10-CM

## 2015-06-07 LAB — VITAMIN D 25 HYDROXY (VIT D DEFICIENCY, FRACTURES): Vit D, 25-Hydroxy: 28 ng/mL — ABNORMAL LOW (ref 30–100)

## 2015-06-09 ENCOUNTER — Other Ambulatory Visit: Payer: Self-pay

## 2015-08-05 ENCOUNTER — Telehealth: Payer: Self-pay | Admitting: Family Medicine

## 2015-08-05 NOTE — Telephone Encounter (Signed)
I called patient and made appointment for Thursday and pt said, I thought you were calling me to make an appointment for my memory issues that my kids are concerned about.  So I told him we would take care of that at the same time.  His kids will be joining the appointment.

## 2015-08-05 NOTE — Telephone Encounter (Signed)
Call and tell him I want to follow-up on blood work concerning possible diabetes. Make a note somewhere to remind me to check him neurologically and possibly refer to neurology.

## 2015-08-05 NOTE — Telephone Encounter (Signed)
Gay Filler sent me a text that they were very concerned about her brother n law Manuel [name].  He is having lots of memory issues and his kids are wanting him evaluated.  They do not want him to know they are requesting the evaluation.  They would like a Neuro referral, so does he need to see you first?  [name]'s mom had Alzheimers.

## 2015-08-07 ENCOUNTER — Encounter: Payer: Self-pay | Admitting: Family Medicine

## 2015-08-07 ENCOUNTER — Ambulatory Visit (INDEPENDENT_AMBULATORY_CARE_PROVIDER_SITE_OTHER): Payer: Medicare Other | Admitting: Family Medicine

## 2015-08-07 VITALS — BP 110/78 | HR 102 | Ht 70.0 in | Wt 196.0 lb

## 2015-08-07 DIAGNOSIS — G3184 Mild cognitive impairment, so stated: Secondary | ICD-10-CM | POA: Diagnosis not present

## 2015-08-08 NOTE — Progress Notes (Signed)
   Subjective:    Patient ID: Manuel [name], male    DOB: July 27, 1941, 76 y.o.   MRN: [id number]  HPI He is here for evaluation of memory difficulty. He notes that since he has retired and is not on a schedule that his memory and recall have diminished. His children have also noted that he is repeating himself. His wife also mentions the fact that he is now losing items such as his keys and wallet. This has been slowly getting worse over the last year or so.   Review of Systems     Objective:   Physical Exam Alert and in no distress. No word searching noted. Appropriate affect and dressed are noted. MMSE score was 23.       Assessment & Plan:  Mild cognitive impairment  I discussed my findings with him explaining that he does have essentially early signs of Alzheimer's. We discussed the possibility of using medication and getting another opinion. I will send him to neurology for further consultation and the possibility of using medication either now or in the near future.

## 2015-09-05 ENCOUNTER — Ambulatory Visit (INDEPENDENT_AMBULATORY_CARE_PROVIDER_SITE_OTHER): Payer: Medicare Other | Admitting: Neurology

## 2015-09-05 ENCOUNTER — Encounter: Payer: Self-pay | Admitting: Neurology

## 2015-09-05 ENCOUNTER — Other Ambulatory Visit (INDEPENDENT_AMBULATORY_CARE_PROVIDER_SITE_OTHER): Payer: Medicare Other

## 2015-09-05 VITALS — BP 134/90 | HR 94 | Resp 16 | Ht 70.0 in | Wt 196.0 lb

## 2015-09-05 DIAGNOSIS — E559 Vitamin D deficiency, unspecified: Secondary | ICD-10-CM

## 2015-09-05 DIAGNOSIS — G3184 Mild cognitive impairment, so stated: Secondary | ICD-10-CM | POA: Diagnosis not present

## 2015-09-05 DIAGNOSIS — E785 Hyperlipidemia, unspecified: Secondary | ICD-10-CM | POA: Diagnosis not present

## 2015-09-05 DIAGNOSIS — I10 Essential (primary) hypertension: Secondary | ICD-10-CM | POA: Diagnosis not present

## 2015-09-05 LAB — TSH: TSH: 1.13 u[IU]/mL (ref 0.35–4.50)

## 2015-09-05 LAB — VITAMIN D 25 HYDROXY (VIT D DEFICIENCY, FRACTURES): VITD: 16.56 ng/mL — AB (ref 30.00–100.00)

## 2015-09-05 LAB — VITAMIN B12: VITAMIN B 12: 257 pg/mL (ref 211–911)

## 2015-09-05 MED ORDER — DONEPEZIL HCL 10 MG PO TABS: ORAL_TABLET | ORAL | Status: DC

## 2015-09-05 NOTE — Patient Instructions (Addendum)
1. Bloodwork for TSH, B12, vitamin D 2. Schedule MRI brain without contrast 3. Start Aricept 10mg : Take 1/2 tablet daily for 1 month, then increase to 1 tablet daily 4. Control of BP, cholesterol, as well as physical exercise and brain stimulation exercises are important for brain health 5. Discuss anxiety with your family doctor 6. Continue to monitor driving 7. Follow-up in 7 months, call for any problems  YOU HAVE BEEN SCHEDULED AT [facility] FOR AN MRI ON 09/15/15.  PLEASE ARRIVE AT 10:30 AM.   [place], [place] [id number]    [phone]

## 2015-09-05 NOTE — Progress Notes (Signed)
NEUROLOGY CONSULTATION NOTE  [name] MRN: [id number] DOB: 12-06-1940  Referring provider: Dr. Jill Alexanders Primary care provider: Dr. Jill Alexanders  Reason for consult:  Memory loss  Dear Dr Redmond School:  Thank you for your kind referral of Manuel [name] for consultation of the above symptoms. Although his history is well known to you, please allow me to reiterate it for the purpose of our medical record. The patient was accompanied to the clinic by his wife and son who also provide collateral information. Records and images were personally reviewed where available.  HISTORY OF PRESENT ILLNESS: This is a pleasant 75 year old right-handed man with a history of hypertension, hyperlipidemia, presenting for evaluation of worsening memory. He feels his memory is pretty good, except for short-term memory, he occasionally forgets names, sometimes forgets his medications. He misplaces things more frequently. He has occasional word-finding difficulties. His son started noticing memory changes more than 2 years ago, but have noticed it worsening over the past year. His wife reports that he asks the same questions repeatedly. He would get something on his mind and then become fixated on it, around 2 years ago he got fixated on death and bought a burial plot, then kept repeating this to his children. His son relates that when the patient's got wife got sick, he and his family had countless conversations about her illness, then one day he asked if they had ever heard of her illness. Conversations are very repetitive, almost in a loop. His son feels that there may be a component of anxiety, when the patient drives down to [place] to visit his son, this is a big obstacle for him to drive on the highway. His son also feels that the patient's stress level with his being caregiver for his wife and having to take his own medications is a big responsibility for him. He denies getting lost driving, but 2 weeks ago  could not find his car in the parking lot at [facility]. His wife has always been in charge of [employer]. No difficulties with ADLs. They deny any significant personality changes, no paranoia. His mother had memory problems. He denies any significant head injuries, very infrequent alcohol intake. He had an MMSE at his PCP office last month which was 23/30.  He has had right-hand weakness for the past 30 years where he cannot flex his fingers. He tries to write mostly with his left hand. He denies any headaches, dizziness, diplopia, dysarthria, dysphagia, neck/back pain, focal numbness/tingling, bowel/bladder dysfunction. No recent falls.   Laboratory Data: Lab Results  Component Value Date   WBC 7.3 09/03/2014   HGB 14.0 09/03/2014   HCT 41.5 09/03/2014   MCV 91.6 09/03/2014   PLT 276 09/03/2014     Chemistry      Component Value Date/Time   NA 139 09/03/2014 1135   K 4.0 09/03/2014 1135   CL 102 09/03/2014 1135   CO2 29 09/03/2014 1135   BUN 17 09/03/2014 1135   CREATININE 0.75 09/03/2014 1135   CREATININE 0.82 07/30/2013 1832      Component Value Date/Time   CALCIUM 9.8 09/03/2014 1135   ALKPHOS 55 09/03/2014 1135   AST 17 09/03/2014 1135   ALT 15 09/03/2014 1135   BILITOT 0.8 09/03/2014 1135      PAST MEDICAL HISTORY: Past Medical History  Diagnosis Date  . Hypertension   . Hypercholesteremia   . Family history of anesthesia complication     MOTHER   .  GERD (gastroesophageal reflux disease)   . Neuromuscular disorder ([place])     ETIOLOGY UNKNOWN   . Internal hemorrhoids   . C. difficile colitis   . Adrenal adenoma   . Arthritis     PAST SURGICAL HISTORY: Past Surgical History  Procedure Laterality Date  . Colonoscopy      3-4 years ago    MEDICATIONS: Current Outpatient Prescriptions on File Prior to Visit  Medication Sig Dispense Refill  . atorvastatin (LIPITOR) 10 MG tablet Take 1 tablet (10 mg total) by mouth daily. 90 tablet 3  . diphenhydrAMINE  (BENADRYL) 25 MG tablet Take 25 mg by mouth at bedtime as needed.    . famotidine (PEPCID) 20 MG tablet Take 1 tablet (20 mg total) by mouth 2 (two) times daily. 180 tablet 2  . ketoconazole (NIZORAL) 2 % cream APPLICATIONS APPLY ON THE SKIN USE ON FACE AND EARS ONCE DAILY  2  . triamcinolone cream (KENALOG) 0.1 % Apply 1 application topically See admin instructions. Apply once a day on weekends    . valsartan-hydrochlorothiazide (DIOVAN-HCT) 160-12.5 MG per tablet Take 1 tablet by mouth daily. 90 tablet 3   No current facility-administered medications on file prior to visit.    ALLERGIES: Allergies  Allergen Reactions  . Ace Inhibitors Cough    Lisinopril     FAMILY HISTORY: Family History  Problem Relation Age of Onset  . Heart attack Father     SOCIAL HISTORY: Social History   Social History  . Marital Status: Married    Spouse Name: N/A  . Number of Children: N/A  . Years of Education: N/A   Occupational History  . Not on file.   Social History Main Topics  . Smoking status: Former Smoker -- 1.00 packs/day for 20 years    Types: Cigarettes    Quit date: 05/11/1982  . Smokeless tobacco: Never Used  . Alcohol Use: 0.6 oz/week    1 Glasses of wine per week     Comment: socially  . Drug Use: Yes  . Sexual Activity: Yes   Other Topics Concern  . 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    REVIEW OF SYSTEMS: Constitutional: No fevers, chills, or sweats, no generalized fatigue, change in appetite Eyes: No visual changes, double vision, eye pain Ear, nose and throat: No hearing loss, ear pain, nasal congestion, sore throat Cardiovascular: No chest pain, palpitations Respiratory:  No shortness of breath at rest or with exertion, wheezes GastrointestinaI: No nausea, vomiting, diarrhea, abdominal pain, fecal incontinence Genitourinary:  No dysuria, urinary retention or frequency Musculoskeletal:  No neck pain, back  pain Integumentary: No rash, pruritus, skin lesions Neurological: as above Psychiatric: No depression, insomnia, anxiety Endocrine: No palpitations, fatigue, diaphoresis, mood swings, change in appetite, change in weight, increased thirst Hematologic/Lymphatic:  No anemia, purpura, petechiae. Allergic/Immunologic: no itchy/runny eyes, nasal congestion, recent allergic reactions, rashes  PHYSICAL EXAM: Filed Vitals:   09/05/15 0824  BP: 134/90  Pulse: 94   General: No acute distress Head:  Normocephalic/atraumatic Eyes: Fundoscopic exam shows bilateral sharp discs, no vessel changes, exudates, or hemorrhages Neck: supple, no paraspinal tenderness, full range of motion Back: No paraspinal tenderness Heart: regular rate and rhythm Lungs: Clear to auscultation bilaterally. Vascular: No carotid bruits. Skin/Extremities: No rash, no edema Neurological Exam: Mental status: alert and oriented to person, place, and time, no dysarthria or aphasia, Fund of knowledge is appropriate.  Recent and remote memory are intact.  Attention and concentration are normal.    Able to name objects and repeat phrases.  Montreal Cognitive Assessment  09/05/2015  Visuospatial/ Executive (0/5) 5  Naming (0/3) 3  Attention: Read list of digits (0/2) 2  Attention: Read list of letters (0/1) 1  Attention: Serial 7 subtraction starting at 100 (0/3) 3  Language: Repeat phrase (0/2) 2  Language : Fluency (0/1) 1  Abstraction (0/2) 2  Delayed Recall (0/5) 0  Orientation (0/6) 5  Total 24  Adjusted Score (based on education) 24   Cranial nerves: CN I: not tested CN II: pupils equal, round and reactive to light, visual fields intact, fundi unremarkable. CN III, IV, VI:  full range of motion, no nystagmus, no ptosis CN V: facial sensation intact CN VII: upper and lower face symmetric CN VIII: hearing intact to finger rub CN IX, X: gag intact, uvula midline CN XI: sternocleidomastoid and trapezius muscles  intact CN XII: tongue midline Bulk & Tone: normal, no fasciculations. Motor: 5/5 throughout except for 0/5 right APB, 2/5 finger flexors, no pronator drift. Sensation: decreased vibration to left ankle, otherwise intact to light touch, cold, pin,and joint position sense.  No extinction to double simultaneous stimulation.  Romberg test negative Deep Tendon Reflexes: +2 throughout, no ankle clonus, negative Hoffman sign Plantar responses: downgoing bilaterally Cerebellar: no incoordination on finger to nose, heel to shin. No dysdiadochokinesia Gait: narrow-based and steady, able to tandem walk adequately. Tremor: none  IMPRESSION: This is a pleasant 75 year old right-handed man with vascular risk factors including hypertension and hyperlipidemia, presenting with worsening memory loss. His neurological exam is overall non-focal, MOCA score 24/30 (normal > 26/30), indicating mild cognitive impairment. Mild cognitive impairment means there are serious cognitive problems by report and testing but the patient is functioning normally. Around 50% of MCI patients progress to dementia (functional impairment) over 5 years. We discussed different causes of memory loss. Check TSH and B12. His vitamin D level was low in November, he has not been taking supplements, would re-check. MRI brain without contrast will be ordered to assess for underlying structural abnormality and assess vascular load. We discussed that he may benefit from starting cholinesterase inhibitors such as Aricept, side effects and expectations from the medication were discussed. He was given a prescription for Aricept 5mg  daily for 1 month, then increase to 10mg  daily. We discussed the importance of control of vascular risk factors, physical exercise, and brain stimulation exercises for brain health. His son also expressed concern about anxiety, and we discussed how mood (depression, anxiety, stress) can also affect memory, they will discuss anxiety  with his PCP. He will follow-up in 7 months and knows to call for any changes.   Thank you for allowing me to participate in the care of this patient. Please do not hesitate to call for any questions or concerns.   Ellouise Newer, M.D.  CC: Dr. Redmond School

## 2015-09-10 ENCOUNTER — Telehealth: Payer: Self-pay | Admitting: Family Medicine

## 2015-09-10 DIAGNOSIS — E559 Vitamin D deficiency, unspecified: Secondary | ICD-10-CM

## 2015-09-10 MED ORDER — VITAMIN D (ERGOCALCIFEROL) 1.25 MG (50000 UNIT) PO CAPS: 50000.0000 [IU] | ORAL_CAPSULE | ORAL | Status: DC

## 2015-09-10 NOTE — Telephone Encounter (Signed)
-----   Message from Cameron Sprang, MD sent at 09/09/2015  1:00 PM EST ----- Pls let him know thyroid and B12 levels are normal, however his vitamin D level is still low. Pls have him call PCP for replacement treatment. Pls send results to PCP as well, thanks

## 2015-09-10 NOTE — Telephone Encounter (Signed)
Pt states he does want to go on the Vitamin D supplement and asks that you send it to [facility] [address]

## 2015-09-10 NOTE — Telephone Encounter (Signed)
Spoke with patient told him know that Dr Redmond School called the medication and and have him return here in 2 or 3 months for a vitamin D level recheck

## 2015-09-10 NOTE — Telephone Encounter (Signed)
Patient's wife Arbie Cookey was notified of results & advisement. Faxed results to Dr. Redmond School.

## 2015-09-10 NOTE — Telephone Encounter (Signed)
Let him know that I called the medication and and have him return here in 2 or 3 months for a vitamin D level recheck

## 2015-09-25 ENCOUNTER — Telehealth: Payer: Self-pay | Admitting: Internal Medicine

## 2015-09-25 DIAGNOSIS — G3184 Mild cognitive impairment, so stated: Secondary | ICD-10-CM

## 2015-09-25 NOTE — Telephone Encounter (Signed)
Pt walked in today stating that he is suppose to have some imaging done somewhere and wasn't sure where to go. i do not see any notes or orders for him to get imaging done. Pt is aware you are out until Monday and we will call him on Monday when you get back in the office

## 2015-09-27 NOTE — Telephone Encounter (Signed)
MRI was ordered. 

## 2015-09-29 NOTE — Telephone Encounter (Signed)
No Prior Auth Required

## 2015-10-06 ENCOUNTER — Other Ambulatory Visit: Payer: Self-pay | Admitting: Family Medicine

## 2015-10-06 ENCOUNTER — Ambulatory Visit
Admission: RE | Admit: 2015-10-06 | Discharge: 2015-10-06 | Disposition: A | Discharge status: Home / Self Care | Payer: Medicare Other | Source: Ambulatory Visit | Attending: Family Medicine | Admitting: Family Medicine

## 2015-10-06 DIAGNOSIS — G3184 Mild cognitive impairment, so stated: Secondary | ICD-10-CM

## 2015-10-06 NOTE — Telephone Encounter (Signed)
Dr Redmond School shall he continue ? VIT D2 1.25 MG (50,000 UNIT) Vitamin D, Ergocalciferol, (DRISDOL) 50000 units CAPS capsule     Sig: TAKE 1 CAPSULE (50,000 UNITS TOTAL) BY MOUTH EVERY 7 (SEVEN) DAYS.    Dispense: 8 capsule   Refills: 0

## 2015-10-21 ENCOUNTER — Ambulatory Visit (INDEPENDENT_AMBULATORY_CARE_PROVIDER_SITE_OTHER): Payer: Medicare Other | Admitting: Family Medicine

## 2015-10-21 ENCOUNTER — Encounter: Payer: Self-pay | Admitting: Family Medicine

## 2015-10-21 VITALS — BP 130/78 | HR 97 | Resp 14 | Ht 70.0 in | Wt 196.6 lb

## 2015-10-21 DIAGNOSIS — G3184 Mild cognitive impairment, so stated: Secondary | ICD-10-CM | POA: Diagnosis not present

## 2015-10-21 MED ORDER — ALPRAZOLAM 0.25 MG PO TABS: 0.2500 mg | ORAL_TABLET | Freq: Two times a day (BID) | ORAL | Status: DC | PRN

## 2015-10-21 NOTE — Patient Instructions (Signed)
Take theDonepezil  one half tablet daily for a month and then increase it to a tablet daily.

## 2015-10-21 NOTE — Progress Notes (Signed)
   Subjective:    Patient ID: Manuel [name], male    DOB: 28-Dec-1940, 75 y.o.   MRN: [id number]  HPI He is here to discuss the mild cognitive impairment diagnosis. He was seen by neurology and supposed to start on Aricept however he read about side effects and decided not to. He was also supposed to have an MRI but apparently did have some anxiety issues with this and apparently now is being rescheduled.   Review of Systems     Objective:   Physical Exam Alert and in no distress. He does show some memory issues and repeating himself but does know that he is repeating himself.       Assessment & Plan:  Mild cognitive impairment I reinforced the need for him to take the medication and recommend he follow up with me in 1 month. I will also give him Xanax to help with the MRI and make sure this is been rescheduled. He gave me permission to discuss this with his son who apparently is on the hippa form

## 2015-10-22 ENCOUNTER — Institutional Professional Consult (permissible substitution): Payer: Medicare Other | Admitting: Family Medicine

## 2015-10-25 ENCOUNTER — Other Ambulatory Visit: Payer: Self-pay | Admitting: Family Medicine

## 2015-10-27 NOTE — Telephone Encounter (Signed)
Have him schedule for repeat vitamin D level in 2 months

## 2015-10-27 NOTE — Telephone Encounter (Signed)
Per labs Vitamin D was still low.  Please advise.

## 2015-10-30 ENCOUNTER — Ambulatory Visit: Admission: RE | Admit: 2015-10-30 | Payer: Medicare Other | Source: Ambulatory Visit

## 2015-10-30 ENCOUNTER — Encounter: Payer: Self-pay | Admitting: Family Medicine

## 2015-10-30 ENCOUNTER — Other Ambulatory Visit: Payer: Self-pay | Admitting: Family Medicine

## 2015-10-30 ENCOUNTER — Ambulatory Visit (INDEPENDENT_AMBULATORY_CARE_PROVIDER_SITE_OTHER): Payer: Medicare Other | Admitting: Family Medicine

## 2015-10-30 VITALS — BP 124/84 | HR 74 | Resp 12 | Ht 70.0 in | Wt 192.4 lb

## 2015-10-30 DIAGNOSIS — J301 Allergic rhinitis due to pollen: Secondary | ICD-10-CM

## 2015-10-30 DIAGNOSIS — G3184 Mild cognitive impairment, so stated: Secondary | ICD-10-CM

## 2015-10-30 NOTE — Telephone Encounter (Signed)
Manuel [name], phoned in med

## 2015-10-30 NOTE — Progress Notes (Signed)
   Subjective:    Patient ID: Manuel [name], male    DOB: September 25, 1940, 75 y.o.   MRN: [id number]  HPI He does have a history of underlying allergies with sneezing, itchy watery eyes, nasal congestion, right greater than left. He also notes breathing difficulties when he is put in a closed environment. He is concerned about having an MRI which is scheduled for today. He has a history of mild cognitive impairment and has started taking the Aricept.   Review of Systems     Objective:   Physical Exam Alert and in no distress. 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       Assessment & Plan:  Mild cognitive impairment  Seasonal allergic rhinitis due to pollen he will continue on the Aricept as previously planned and I'll follow-up with this in a month. Discussed use of Xanax prior to having the MRI. Encouraged him to use this aggressively. Also recommend Afrin nasal spray to help with any nasal problems especially for the MRI.

## 2015-10-30 NOTE — Patient Instructions (Addendum)
Take the alprazolam(Xanax) about an hour ahead of time and it is still not where you need to be an half an hour later take it again.the nasal congestion prior to the MRI use Afrin nasal spray Use Allegra or Claritin daily and you can also use Flonase

## 2015-11-06 ENCOUNTER — Telehealth: Payer: Self-pay | Admitting: Family Medicine

## 2015-11-06 NOTE — Telephone Encounter (Signed)
Called [facility] to see if we could get the pt in on the same day as his appt here.  They did not have any openings.  They had an appt for tomorrow at 3:00 so I scheduled that.  Called pt talked with Arbie Cookey his wife and she said that would not work.  So I asked her what date and she said she didn't want to schedule anything until they came in here and saw you on Tuesday.  [facility] does not have a true OPEN MRI  It is a bigger MRI not open.  [facility] has a true open MRI.

## 2015-11-07 ENCOUNTER — Other Ambulatory Visit: Payer: Medicare Other

## 2015-11-09 ENCOUNTER — Other Ambulatory Visit: Payer: Self-pay | Admitting: Family Medicine

## 2015-11-10 ENCOUNTER — Other Ambulatory Visit: Payer: Self-pay

## 2015-11-10 NOTE — Telephone Encounter (Signed)
Is this okay to refill? 

## 2015-11-10 NOTE — Telephone Encounter (Signed)
Called xanax in per jcl 

## 2015-11-10 NOTE — Telephone Encounter (Signed)
OK to renew 

## 2015-11-11 ENCOUNTER — Telehealth: Payer: Self-pay | Admitting: Family Medicine

## 2015-11-11 ENCOUNTER — Encounter: Payer: Self-pay | Admitting: Family Medicine

## 2015-11-11 ENCOUNTER — Ambulatory Visit (INDEPENDENT_AMBULATORY_CARE_PROVIDER_SITE_OTHER): Payer: Medicare Other | Admitting: Family Medicine

## 2015-11-11 VITALS — BP 122/80 | Wt 194.0 lb

## 2015-11-11 DIAGNOSIS — G3184 Mild cognitive impairment, so stated: Secondary | ICD-10-CM | POA: Diagnosis not present

## 2015-11-11 NOTE — Progress Notes (Signed)
   Subjective:    Patient ID: RIGSBY PEREGRINO, male    DOB: 1941/05/19, 75 y.o.   MRN: [id number]  HPI He is here with his wife and daughter. When asked concerning his medications, he has only within the last week started taking Aricept. He again expressed concerns over possible side effects. There is question as to whether he is taking his medications adequately. He also has not had the MRI done due to claustrophobia. He was given Xanax apparently this did not help.   Review of Systems     Objective:   Physical Exam Alert and in no distress otherwise not examined       Assessment & Plan:  Mild cognitive impairment I discussed his medication management and strongly encouraged having his medications put out on a daily basis and a pillbox. His daughter will work with doing this. Encouraged him to take the Aricept as prescribed. Will also give him Xanax to use prior to the MRI. I did show him a picture of the MRI and will have an open MRI done. I will also discuss possibly having some of his male friends come by to help get him to the encounter to help with his phobia. Over 25 minutes spent discussing all these issues with him his wife and daughter.

## 2015-11-11 NOTE — Patient Instructions (Signed)
You can start day before taking the Xanax and increase it as needed before that MRI

## 2015-11-11 NOTE — Telephone Encounter (Signed)
New HIPAA signed.  Includes dtr and son.   Dtr Gonzella Lex [phone]

## 2015-11-11 NOTE — Telephone Encounter (Signed)
Open MRI Scheduled for [facility] Thursday 4/13 at 2:30  Patient, wife and dtr aware.  Faxed order over [phone].  [facility] ph [phone]

## 2015-11-13 ENCOUNTER — Telehealth: Payer: Self-pay | Admitting: Family Medicine

## 2015-11-13 NOTE — Telephone Encounter (Signed)
Pt wife called and wanted to let you know that Manuel [name] had taking one xanax yesterday morning and one yesterday afternoon and one this morning and is going to take  before he goes to the MRI, They just wanted to let you know, she wanted to inform you what was going on,

## 2015-11-13 NOTE — Telephone Encounter (Signed)
Pt said he is doing good and he will have MRI

## 2015-11-13 NOTE — Telephone Encounter (Signed)
Check in with them around lunchtime and see how he is doing

## 2015-11-18 ENCOUNTER — Telehealth: Payer: Self-pay | Admitting: Family Medicine

## 2015-11-18 NOTE — Telephone Encounter (Signed)
-----   Message from Cameron Sprang, MD sent at 11/18/2015  1:20 PM EDT ----- Regarding: MRI results Pls let patient know I reviewed MRI brain, it is unremarkable, no evidence of tumor, stroke, or bleed. It shows age-related changes. THanks

## 2015-11-18 NOTE — Telephone Encounter (Signed)
Patient was notified of result. 

## 2015-11-19 ENCOUNTER — Telehealth: Payer: Self-pay | Admitting: Family Medicine

## 2015-11-19 NOTE — Telephone Encounter (Signed)
Dtr called and asked for med list.  Same sent.  Manuel [name] is on HIPAA.  [email or web address]

## 2015-11-26 ENCOUNTER — Encounter: Payer: Self-pay | Admitting: Neurology

## 2015-12-02 ENCOUNTER — Ambulatory Visit (INDEPENDENT_AMBULATORY_CARE_PROVIDER_SITE_OTHER): Payer: Medicare Other | Admitting: Family Medicine

## 2015-12-02 ENCOUNTER — Encounter: Payer: Self-pay | Admitting: Family Medicine

## 2015-12-02 ENCOUNTER — Ambulatory Visit: Payer: Medicare Other | Admitting: Family Medicine

## 2015-12-02 VITALS — BP 120/82 | Wt 195.4 lb

## 2015-12-02 DIAGNOSIS — G3184 Mild cognitive impairment, so stated: Secondary | ICD-10-CM

## 2015-12-02 NOTE — Patient Instructions (Signed)
The full tablet of the Aricept daily and see back here in about a month

## 2015-12-02 NOTE — Progress Notes (Signed)
   Subjective:    Patient ID: Manuel [name], male    DOB: 07-Jun-1941, 75 y.o.   MRN: [id number]  HPI Here for recheck. He did have a recent MRI which was negative. Presently he is taking one half of a Aricept tablet every day. He thinks that it might be helping him with his sleep. He is here alone and therefore no one can validate anything.He did not mention any change in his cognitive abilities.   Review of Systems     Objective:   Physical Exam  AlertIn no distress. No major cognitive dysfunction is noted although he did repeat something from his distant past.       Assessment & Plan:  Mild cognitive impairment He is to increase his pill to 1 pill every day and return here in 1 month. I will probably then add Namenda to his regimen.

## 2015-12-27 ENCOUNTER — Emergency Department (HOSPITAL_COMMUNITY): Payer: Medicare Other

## 2015-12-27 ENCOUNTER — Emergency Department (HOSPITAL_COMMUNITY)
Admission: EM | Admit: 2015-12-27 | Discharge: 2015-12-27 | Disposition: A | Discharge status: Home / Self Care | Payer: Medicare Other | Attending: Emergency Medicine | Admitting: Emergency Medicine

## 2015-12-27 ENCOUNTER — Encounter (HOSPITAL_COMMUNITY): Payer: Self-pay | Admitting: Emergency Medicine

## 2015-12-27 DIAGNOSIS — Z79899 Other long term (current) drug therapy: Secondary | ICD-10-CM | POA: Insufficient documentation

## 2015-12-27 DIAGNOSIS — Z87891 Personal history of nicotine dependence: Secondary | ICD-10-CM | POA: Diagnosis not present

## 2015-12-27 DIAGNOSIS — Y999 Unspecified external cause status: Secondary | ICD-10-CM | POA: Diagnosis not present

## 2015-12-27 DIAGNOSIS — Y29XXXA Contact with blunt object, undetermined intent, initial encounter: Secondary | ICD-10-CM | POA: Diagnosis not present

## 2015-12-27 DIAGNOSIS — S61412A Laceration without foreign body of left hand, initial encounter: Secondary | ICD-10-CM | POA: Insufficient documentation

## 2015-12-27 DIAGNOSIS — Y929 Unspecified place or not applicable: Secondary | ICD-10-CM | POA: Diagnosis not present

## 2015-12-27 DIAGNOSIS — I10 Essential (primary) hypertension: Secondary | ICD-10-CM | POA: Insufficient documentation

## 2015-12-27 DIAGNOSIS — Z23 Encounter for immunization: Secondary | ICD-10-CM | POA: Diagnosis not present

## 2015-12-27 DIAGNOSIS — Y9389 Activity, other specified: Secondary | ICD-10-CM | POA: Insufficient documentation

## 2015-12-27 DIAGNOSIS — S6992XA Unspecified injury of left wrist, hand and finger(s), initial encounter: Secondary | ICD-10-CM | POA: Diagnosis present

## 2015-12-27 DIAGNOSIS — M79642 Pain in left hand: Secondary | ICD-10-CM

## 2015-12-27 MED ORDER — TETANUS-DIPHTH-ACELL PERTUSSIS 5-2.5-18.5 LF-MCG/0.5 IM SUSP
0.5000 mL | Freq: Once | INTRAMUSCULAR | Status: AC
Start: 2015-12-27 — End: 2015-12-27
  Administered 2015-12-27: 0.5 mL via INTRAMUSCULAR
  Filled 2015-12-27: qty 0.5

## 2015-12-27 NOTE — Discharge Instructions (Signed)
Sterile Tape Wound Care °Some cuts and wounds can be closed using sterile tape, also called skin adhesive strips. Skin adhesive strips can be used for shallow (superficial) and simple cuts, wounds, lacerations, and surgical incisions. These strips act in place of stitches to hold the edges of the wound together, allowing for faster healing. Unlike stitches, the adhesive strips do not require needles or anesthetic medicine for placement. The strips will wear off naturally as the wound is healing. It is important to take proper care of your wound at home while it heals.  °HOME CARE INSTRUCTIONS °· Try to keep the area around your wound clean and dry. Do not allow the adhesive strips to get wet for the first 12 hours.   °· Do not use any soaps or ointments on the wound for the first 12 hours.   °· If a bandage (dressing) has been applied, follow your health care provider's instructions for how often to change the dressing. Keep the dressing dry if one has been applied.   °· Do not remove the adhesive strips. They will fall off on their own. If they do not, you may remove them gently after 10 days. You should gently wet the strips before removing them. For example, this can be done in the shower. °· Do not scratch, pick, or rub the wound area.   °· Protect the wound from further injury until it is healed.   °· Protect the wound from sun and tanning bed exposure while it is healing and for several weeks after healing.   °· Only take over-the-counter or prescription medicines as directed by your health care provider.   °· Keep all follow-up appointments as directed by your health care provider.   °SEEK MEDICAL CARE IF: °Your adhesive strips become wet or soaked with blood before the wound has healed. The tape will need to be replaced.  °SEEK IMMEDIATE MEDICAL CARE IF: °· You have increasing pain in the wound.   °· You develop a rash after the strips are applied. °· Your wound becomes red, swollen, hot, or tender.   °· You  have a red streak that goes away from the wound.   °· You have pus coming from the wound.   °· You have increased bleeding from the wound. °· You notice a bad smell coming from the wound.   °· Your wound breaks open. °MAKE SURE YOU: °· Understand these instructions. °· Will watch your condition. °· Will get help right away if you are not doing well or get worse. °  °This information is not intended to replace advice given to you by your health care provider. Make sure you discuss any questions you have with your health care provider. °  °Document Released: 08/26/2004 Document Revised: 08/09/2014 Document Reviewed: 02/07/2013 °Elsevier Interactive Patient Education ©2016 [employer] ° °

## 2015-12-27 NOTE — ED Notes (Signed)
Patient transported to X-ray 

## 2015-12-27 NOTE — ED Provider Notes (Signed)
CSN: [id number]     Arrival date & time 12/27/15  0912 History   First MD Initiated Contact with Patient 12/27/15 0914     Chief Complaint  Patient presents with  . Hand Injury     HPI   Manuel [name] is an 75 y.o. male with history of HTN, HLD, early dementia who presents to the ED for evaluation of left hand injury. He states he was in his usual state of health until yesterday when he was holding a ladder for someone when the ladder somehow slipped, slid down, and injured his left hand. He states that he has a skin tear on the back of his hand but otherwise no pain, numbness, tingling. He state he is able to move his hand well. He states he is here to make sure everything is okay as his left hand is his main functioning hand (lost movement in right hand years ago). He has put a bandage with ointment over the skin tear otherwise nothing else. Unsure of last tetanus shot.  Past Medical History  Diagnosis Date  . Hypertension   . Hypercholesteremia   . Family history of anesthesia complication     MOTHER   . GERD (gastroesophageal reflux disease)   . Neuromuscular disorder ([place])     ETIOLOGY UNKNOWN   . Internal hemorrhoids   . C. difficile colitis   . Adrenal adenoma   . Arthritis    Past Surgical History  Procedure Laterality Date  . Colonoscopy      3-4 years ago   Family History  Problem Relation Age of Onset  . Heart attack Father    Social History  Substance Use Topics  . Smoking status: Former Smoker -- 1.00 packs/day for 20 years    Types: Cigarettes    Quit date: 05/11/1982  . Smokeless tobacco: Never Used  . Alcohol Use: 0.6 oz/week    1 Glasses of wine per week     Comment: occasionally    Review of Systems  All other systems reviewed and are negative.     Allergies  Ace inhibitors  Home Medications   Prior to Admission medications   Medication Sig Start Date End Date Taking? Authorizing Provider  ALPRAZolam Duanne Moron) 0.25 MG tablet TAKE 1 TABLET  TWICE A DAY AS NEEDED FOR ANXIETY 11/10/15   Denita Lung, MD  atorvastatin (LIPITOR) 10 MG tablet Take 1 tablet (10 mg total) by mouth daily. Patient taking differently: Take 10 mg by mouth. Patient takes occasionally 04/18/15   Denita Lung, MD  clindamycin (CLEOCIN) 150 MG capsule Take 150 mg by mouth 3 (three) times daily.    Historical Provider, MD  diphenhydrAMINE (BENADRYL) 25 MG tablet Take 25 mg by mouth at bedtime as needed.    Historical Provider, MD  donepezil (ARICEPT) 10 MG tablet Take 1/2 tablet daily for 1 month, then increase to 1 tablet daily and continue 09/05/15   Cameron Sprang, MD  famotidine (PEPCID) 20 MG tablet Take 1 tablet (20 mg total) by mouth 2 (two) times daily. 05/07/15   Jerene Bears, MD  ketoconazole (NIZORAL) 2 % cream Reported on 10/21/2015 02/10/15   Historical Provider, MD  triamcinolone cream (KENALOG) 0.1 % Apply 1 application topically See admin instructions. Reported on 10/21/2015    Historical Provider, MD  valsartan-hydrochlorothiazide (DIOVAN-HCT) 160-12.5 MG per tablet Take 1 tablet by mouth daily. 04/18/15   Denita Lung, MD  Vitamin D, Ergocalciferol, (DRISDOL) 50000 units CAPS capsule  TAKE 1 CAPSULE (50,000 UNITS TOTAL) BY MOUTH EVERY 7 (SEVEN) DAYS. 10/27/15   Denita Lung, MD   BP 126/79 mmHg  Pulse 96  Temp(Src) 98.3 F (36.8 C) (Oral)  Resp 14  Ht 5\' 10"  (1.778 m)  Wt 87.091 kg  BMI 27.55 kg/m2  SpO2 100% Physical Exam  Constitutional: He is oriented to person, place, and time. No distress.  HENT:  Head: Atraumatic.  Right Ear: External ear normal.  Left Ear: External ear normal.  Nose: Nose normal.  Eyes: Conjunctivae are normal. No scleral icterus.  Neck: Normal range of motion. Neck supple.  Cardiovascular: Normal rate and regular rhythm.   Pulmonary/Chest: Effort normal. No respiratory distress. He exhibits no tenderness.  Abdominal: Soft. He exhibits no distension.  Musculoskeletal:  Dorsum of left hand with 3cm skin tear, well  approximated skin flap. No bleeding. Nontender. No edema. 2+ radial pulses. Brisk cap refill x 5. Normal finger-thumb opposition. 5/5 grip strength. FROM of wrist.  Neurological: He is alert and oriented to person, place, and time.  Skin: Skin is warm and dry. He is not diaphoretic.  Psychiatric: He has a normal mood and affect. His behavior is normal.  Nursing note and vitals reviewed.   ED Course  Procedures (including critical care time)  LACERATION REPAIR Performed by: Delrae Rend Authorized by: Delrae Rend Consent: Verbal consent obtained. Risks and benefits: risks, benefits and alternatives were discussed Consent given by: patient Patient identity confirmed: provided demographic data Prepped and Draped in normal sterile fashion Wound explored  Laceration Location: left hand  Laceration Length: 3 cm  No Foreign Bodies seen or palpated  Anesthesia: none  Amount of cleaning: standard, benzoin  Skin closure: steri strips x 2  Patient tolerance: Patient tolerated the procedure well with no immediate complications.  Labs Review Labs Reviewed - No data to display  Imaging Review Dg Hand Complete Left  12/27/2015  CLINICAL DATA:  Left hand laceration after injury on ladder. Initial encounter. EXAM: LEFT HAND - COMPLETE 3+ VIEW COMPARISON:  None. FINDINGS: There is no evidence of fracture or dislocation. Narrowing and osteophyte formation is seen involving the second through fifth distal interphalangeal joints consistent with osteoarthritis. Soft tissues are unremarkable. 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 No acute abnormality seen in the left hand. Electronically Signed   By: Marijo Conception, M.D.   On: 12/27/2015 09:53   I have personally reviewed and evaluated these images and lab results as part of my medical decision-making.   EKG Interpretation None      MDM   Final diagnoses:  Left hand pain  Skin tear of left hand without complication,  initial encounter    Skin tear well approximated. Dressed with benzoin and steri-strips. X-ray negative. Care instructions discussed. Discharged home with instructions for PCP follow up. ER return precautions given.    Anne Ng, PA-C 12/27/15 [address], PA-C 12/27/15 [place], MD 12/27/15 [phone]

## 2015-12-27 NOTE — ED Notes (Signed)
Per pt, injured top of left hand on ladder yesterday-skin tear, bleeding controlled

## 2016-01-05 ENCOUNTER — Ambulatory Visit: Payer: Medicare Other | Admitting: Family Medicine

## 2016-01-26 ENCOUNTER — Ambulatory Visit (INDEPENDENT_AMBULATORY_CARE_PROVIDER_SITE_OTHER): Payer: Medicare Other | Admitting: Family Medicine

## 2016-01-26 ENCOUNTER — Encounter: Payer: Self-pay | Admitting: Family Medicine

## 2016-01-26 VITALS — BP 120/70 | HR 94 | Wt 200.0 lb

## 2016-01-26 DIAGNOSIS — L723 Sebaceous cyst: Secondary | ICD-10-CM

## 2016-01-26 DIAGNOSIS — L089 Local infection of the skin and subcutaneous tissue, unspecified: Secondary | ICD-10-CM

## 2016-01-26 NOTE — Patient Instructions (Signed)
The area clean and dry. Take the packing out in 2 days and irrigateit in the shower several times per day

## 2016-01-26 NOTE — Progress Notes (Signed)
   Subjective:    Patient ID: Manuel [name], male    DOB: 06/07/1941, 75 y.o.   MRN: [id number]  HPI He is here for evaluation of a red hot tender lesion in the right upper back. This started 2 days ago.   Review of Systems     Objective:   Physical Exam Red warm and tender 3 cm lesion noted in the right upper back.       Assessment & Plan:  Infected sebaceous cyst The lesion was injected with Xylocaine and epinephrine. A 3 cm incision was made and purulent material as well as cyst sac was expressed without difficulty. Wound was cleaned. No cyst sac remain. It was packed with iodoform. He is to leave the dressing in place for 48 hours, remove it at that time and then irrigate. He will call if further trouble.

## 2016-02-14 ENCOUNTER — Other Ambulatory Visit: Payer: Self-pay | Admitting: Family Medicine

## 2016-02-24 ENCOUNTER — Other Ambulatory Visit: Payer: Self-pay | Admitting: Internal Medicine

## 2016-03-30 ENCOUNTER — Ambulatory Visit: Payer: Medicare Other | Admitting: Family Medicine

## 2016-04-01 ENCOUNTER — Encounter: Payer: Self-pay | Admitting: Family Medicine

## 2016-04-01 ENCOUNTER — Ambulatory Visit (INDEPENDENT_AMBULATORY_CARE_PROVIDER_SITE_OTHER): Payer: Medicare Other | Admitting: Family Medicine

## 2016-04-01 VITALS — BP 120/78 | Wt 200.0 lb

## 2016-04-01 DIAGNOSIS — G3184 Mild cognitive impairment, so stated: Secondary | ICD-10-CM

## 2016-04-01 DIAGNOSIS — L723 Sebaceous cyst: Secondary | ICD-10-CM | POA: Diagnosis not present

## 2016-04-01 MED ORDER — MEMANTINE HCL 5 MG PO TABS: 5.0000 mg | ORAL_TABLET | Freq: Two times a day (BID) | ORAL | 2 refills | Status: DC

## 2016-04-01 NOTE — Progress Notes (Signed)
   Subjective:    Patient ID: Manuel [name], male    DOB: [birth year], 75 y.o.   MRN: [id number]  HPI He is here for evaluation of 2 cystic lesions on his upper back and posterior neck area. Neither of them are giving him any trouble. We also discussed his cognitive impairment and he does note that he has had some difficulties with his memory. He states that he does think he is doing slightly better. I was informed by the front office that he called several times forgetting that he called over the last several days.   Review of Systems     Objective:   Physical Exam Alert and in no distress. 2 2cm round smooth movable lesions are noted, one on the posterior lower neck and the other on his upper back area.       Assessment & Plan:  Mild cognitive impairment - Plan: memantine (NAMENDA) 5 MG tablet  Sebaceous cyst I will add Namenda to his regimen. He is to return here in one month for recheck. No therapy needed for the cyst since there not causing troubles. He was comfortable with this.

## 2016-04-06 ENCOUNTER — Telehealth: Payer: Self-pay | Admitting: Family Medicine

## 2016-04-06 NOTE — Telephone Encounter (Signed)
Pt called from pharmacy confused about his new medication, he thought he hadn't gotten it, etc.  I talked with pharmacist and he picked it up 04/03/17.  Pt went home & found it and came back into our office.  I went over instructions for taking this meidcation and he seemed to understand.  Also reminded him of Appt with [facility] tomorrow

## 2016-04-07 ENCOUNTER — Encounter: Payer: Self-pay | Admitting: Neurology

## 2016-04-07 ENCOUNTER — Ambulatory Visit (INDEPENDENT_AMBULATORY_CARE_PROVIDER_SITE_OTHER): Payer: Medicare Other | Admitting: Neurology

## 2016-04-07 VITALS — BP 130/72 | HR 91 | Temp 98.2°F | Ht 70.0 in | Wt 201.6 lb

## 2016-04-07 DIAGNOSIS — F03A0 Unspecified dementia, mild, without behavioral disturbance, psychotic disturbance, mood disturbance, and anxiety: Secondary | ICD-10-CM

## 2016-04-07 DIAGNOSIS — F0390 Unspecified dementia without behavioral disturbance: Secondary | ICD-10-CM | POA: Diagnosis not present

## 2016-04-07 MED ORDER — DONEPEZIL HCL 10 MG PO TABS: ORAL_TABLET | ORAL | 11 refills | Status: DC

## 2016-04-07 NOTE — Patient Instructions (Addendum)
1. You should be taking both the Donepezil 10mg  daily and Memantine 5mg  twice a day as prescribed 2. Refer to [facility] for visiting nurse to help with medications 3. Control of blood pressure, cholesterol, as well as physical exercise and brain stimulation exercises are important for brain health 4. Follow-up in 6 months

## 2016-04-07 NOTE — Progress Notes (Signed)
NEUROLOGY FOLLOW UP OFFICE NOTE  CARI VOSHELL [id number]  HISTORY OF PRESENT ILLNESS: I had the pleasure of seeing Basil Gadison in follow-up in the neurology clinic on 04/07/2016.  The patient was last seen 7 months ago for worsening memory loss. He is again accompanied by his wife who helps supplement the history today. MOCA score in February 2017 was 24/30, indicating mild cognitive impairment. He was prescribed Donepezil, but tells me today that he stopped it and does not recall why. Records and images were personally reviewed where available.  I personally reviewed MRI brain without contrast which did not show any acute changes, there was mild diffuse atrophy and chronic microvascular disease. TSH and B12 were normal, his vitamin D level was low. He was given vitamin D supplementation by his PCP in February.   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 He denies getting lost driving, but does state that one time he thought he was lost and wondered where he was. He is in charge of his own medications but states he occasionally forgets. He reports that he stopped the Aricept but does not recall why. He was started on Namenda by his PCP and states he took this for the past 2 days but kept repeating he was afraid it would affect his driving down to [place] with a friend with poor vision. On review of PCP notes, he had called the office several times, forgetting he had called over the last several days. He also called from the pharmacy, confused about the Namenda, thinking he had not gotten it, but when the pharmacy was called, he had picked it up 4 days prior. He went home and found the medication and returned to his PCP office where instructions were again repeated. His wife states that everyday he would ask when they were going to a  football game, even if it was winter or summer. He was talking about boys in his backyard that were practicing soccer, his wife states he does not coach them but fantasizes that he is coaching them. His wife also endorses a lot of anxiety, "he is afraid of everything and would not be reassured." He took Xanax for a little time but stopped it. He continues to endorse anxiety.   He denies any headaches, dizziness, diplopia, dysarthria, dysphagia, neck/back pain, focal numbness/tingling, bowel/bladder dysfunction. He fell last week, no injuries. He reports "several falls" since his last visit.   HPI 09/05/15: This is a pleasant 75 yo RH man with a history of hypertension, hyperlipidemia, who presented for evaluation of worsening memory. He feels his memory is pretty good, except for short-term memory, he occasionally forgets names, sometimes forgets his medications. He misplaces things more frequently. He has occasional word-finding difficulties. His son started noticing memory changes more than 2 years ago, but have noticed it worsening over the past year. His wife reports that he asks the same questions repeatedly. He would get something on his mind and then become fixated on it, around 2 years ago he got fixated on death and bought a burial plot, then kept repeating this to his children. His son relates that when the patient's got wife got sick, he and his family had countless conversations about her illness, then one day he asked if they had ever heard of her illness. Conversations are  very repetitive, almost in a loop. His son feels that there may be a component of anxiety, when the patient drives down to [place] to visit his son, this is a big obstacle for him to drive on the highway. His son also feels that the patient's stress level with his being caregiver for his wife and having to take his own medications is a big responsibility for him. He denies getting lost driving, but 2 weeks ago could not find his  car in the parking lot at [facility]. His wife has always been in charge of [employer]. No difficulties with ADLs. They deny any significant personality changes, no paranoia. His mother had memory problems. He denies any significant head injuries, very infrequent alcohol intake. He had an MMSE at his PCP office in January 2017 which was 23/30.  He has had right-hand weakness for the past 30 years where he cannot flex his fingers. He tries to write mostly with his left hand.   PAST MEDICAL HISTORY: Past Medical History:  Diagnosis Date  . Adrenal adenoma   . Arthritis   . C. difficile colitis   . Family history of anesthesia complication    MOTHER   . GERD (gastroesophageal reflux disease)   . Hypercholesteremia   . Hypertension   . Internal hemorrhoids   . Neuromuscular disorder ([place])    ETIOLOGY UNKNOWN     MEDICATIONS: Current Outpatient Prescriptions on File Prior to Visit  Medication Sig Dispense Refill  . ALPRAZolam (XANAX) 0.25 MG tablet TAKE 1 TABLET TWICE A DAY AS NEEDED FOR ANXIETY 20 tablet 0  . atorvastatin (LIPITOR) 10 MG tablet TAKE 1 TABLET (10 MG TOTAL) BY MOUTH DAILY. 90 tablet 3  . clindamycin (CLEOCIN) 150 MG capsule Take 150 mg by mouth 3 (three) times daily. Reported on 01/26/2016    . diphenhydrAMINE (BENADRYL) 25 MG tablet Take 25 mg by mouth at bedtime as needed.    . donepezil (ARICEPT) 10 MG tablet Take 1/2 tablet daily for 1 month, then increase to 1 tablet daily and continue (Patient not taking) 30 tablet 11  . famotidine (PEPCID) 20 MG tablet TAKE 1 TABLET BY MOUTH TWICE A DAY 180 tablet 2  . ketoconazole (NIZORAL) 2 % cream Reported on 10/21/2015  2  . memantine (NAMENDA) 5 MG tablet Take 1 tablet (5 mg total) by mouth 2 (two) times daily. 30 tablet 2  . triamcinolone cream (KENALOG) 0.1 % Apply 1 application topically See admin instructions. Reported on 10/21/2015    . valsartan-hydrochlorothiazide (DIOVAN-HCT) 160-12.5 MG per tablet Take 1 tablet by  mouth daily. 90 tablet 3  . Vitamin D, Ergocalciferol, (DRISDOL) 50000 units CAPS capsule TAKE 1 CAPSULE (50,000 UNITS TOTAL) BY MOUTH EVERY 7 (SEVEN) DAYS. 8 capsule 0   No current facility-administered medications on file prior to visit.     ALLERGIES: Allergies  Allergen Reactions  . Ace Inhibitors Cough    Lisinopril     FAMILY HISTORY: Family History  Problem Relation Age of Onset  . Heart attack Father     SOCIAL HISTORY: Social History   Social History  . Marital status: Married    Spouse name: N/A  . Number of children: 2  . Years of education: N/A   Occupational History  . Retired    Social History Main Topics  . Smoking status: Former Smoker    Packs/day: 1.00    Years: 20.00    Types: Cigarettes    Quit date: 05/11/1982  . Smokeless  tobacco: Never Used  . Alcohol use 0.6 oz/week    1 Glasses of wine per week     Comment: occasionally  . Drug use: No  . Sexual activity: Yes   Other Topics Concern  . 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    REVIEW OF SYSTEMS: Constitutional: No fevers, chills, or sweats, no generalized fatigue, change in appetite Eyes: No visual changes, double vision, eye pain Ear, nose and throat: No hearing loss, ear pain, nasal congestion, sore throat Cardiovascular: No chest pain, palpitations Respiratory:  No shortness of breath at rest or with exertion, wheezes GastrointestinaI: No nausea, vomiting, diarrhea, abdominal pain, fecal incontinence Genitourinary:  No dysuria, urinary retention or frequency Musculoskeletal:  No neck pain, back pain Integumentary: No rash, pruritus, skin lesions Neurological: as above Psychiatric: No depression, insomnia, anxiety Endocrine: No palpitations, fatigue, diaphoresis, mood swings, change in appetite, change in weight, increased thirst Hematologic/Lymphatic:  No anemia, purpura, petechiae. Allergic/Immunologic: no itchy/runny eyes, nasal congestion,  recent allergic reactions, rashes  PHYSICAL EXAM: Vitals:   04/07/16 0828  BP: 130/72  Pulse: 91  Temp: 98.2 F (36.8 C)   General: No acute distress Head:  Normocephalic/atraumatic Neck: supple, no paraspinal tenderness, full range of motion Heart:  Regular rate and rhythm Lungs:  Clear to auscultation bilaterally Back: No paraspinal tenderness Skin/Extremities: No rash, no edema Neurological Exam: alert and oriented to person, place, and day of week and year. States month is January and it is winter. No aphasia or dysarthria. Fund of knowledge is appropriate.  Remote memory intact.  Attention and concentration are normal.    Able to name objects and repeat phrases.  MMSE - Mini Mental State Exam 04/07/2016  Orientation to time 2  Orientation to Place 5  Registration 3  Attention/ Calculation 5  Recall 0  Language- name 2 objects 2  Language- repeat 1  Language- follow 3 step command 2  Language- read & follow direction 1  Write a sentence 1  Copy design 1  Total score 23   Cranial nerves: Pupils equal, round, reactive to light. Extraocular movements intact with no nystagmus. Visual fields full. Facial sensation intact. No facial asymmetry. Tongue, uvula, palate midline.  Motor: Bulk and tone normal, muscle strength 5/5 throughout except for right hand weakness (unchanged from previous), no pronator drift.  Sensation to light touch,  intact.  No extinction to double simultaneous stimulation.  Deep tendon reflexes 2+ throughout, toes downgoing.  Finger to nose testing intact.  Gait narrow-based and steady. Romberg negative.  IMPRESSION: This is a pleasant 75 yo RH man with vascular risk factors including hypertension and hyperlipidemia, with worsening memory loss. His neurological exam is overall non-focal, MMSE today 23/30 (similar to PCP office in January 2017. MOCA score 24/30 in February 2017). He appears to be having more difficulties with his medications, and has called his PCP  office several times. Family tells him he repeats himself. 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 MRI brain unremarkable. He was started on Namenda 5mg  BID this week. He had stopped Aricept for unrecalled reasons and was instructed to restart taking it. Side effects of medications were discussed and patient was reassured about his concerns regarding the medications. We will set him up with a visiting nurse for help with medications. Continue to monitor driving. He and his wife again endorsed significant anxiety and will discuss this with his PCP. We again discussed the importance of control  of vascular risk factors, physical exercise, and brain stimulation exercises for brain health.  He will follow-up in 6 months and knows to call for any changes  Thank you for allowing me to participate in his care.  Please do not hesitate to call for any questions or concerns.  The duration of this appointment visit was 25 minutes of face-to-face time with the patient.  Greater than 50% of this time was spent in counseling, explanation of diagnosis, planning of further management, and coordination of care.   Ellouise Newer, M.D.   CC: Dr. Redmond School

## 2016-05-03 ENCOUNTER — Encounter: Payer: Self-pay | Admitting: Family Medicine

## 2016-05-03 ENCOUNTER — Ambulatory Visit (INDEPENDENT_AMBULATORY_CARE_PROVIDER_SITE_OTHER): Payer: Medicare Other | Admitting: Family Medicine

## 2016-05-03 VITALS — BP 120/70 | HR 75 | Wt 202.0 lb

## 2016-05-03 DIAGNOSIS — G3184 Mild cognitive impairment, so stated: Secondary | ICD-10-CM

## 2016-05-03 NOTE — Progress Notes (Signed)
   Subjective:    Patient ID: Manuel [name], male    DOB: 1941/05/30, 75 y.o.   MRN: [id number]  HPI He is here for medication check. For some reason he had not started the Namenda until about one week ago. He does however state that he is doing much better. 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 His wife is also noted an improvement. He was seen recently by neurology and the recommendation for nurse visit to help with medications was made but they state that he does not been followed through on.   Review of Systems     Objective:   Physical Exam Alert and in no distress. His demeanor did seem to be much better. The medications were reviewed. I did mark on his med sheet the ones that he needs to be taken on a daily basis.       Assessment & Plan:  Mild cognitive impairment Continue on his present medications and recheck here in about 6 weeks. Will get home nurse visit and see if we can get a regular nursing visit for medication management.

## 2016-05-13 ENCOUNTER — Encounter: Payer: Self-pay | Admitting: Family Medicine

## 2016-05-13 ENCOUNTER — Ambulatory Visit (INDEPENDENT_AMBULATORY_CARE_PROVIDER_SITE_OTHER): Payer: Medicare Other | Admitting: Family Medicine

## 2016-05-13 VITALS — BP 136/76 | HR 72 | Temp 97.1°F | Ht 70.0 in | Wt 201.0 lb

## 2016-05-13 DIAGNOSIS — K1120 Sialoadenitis, unspecified: Secondary | ICD-10-CM

## 2016-05-13 MED ORDER — AMOXICILLIN-POT CLAVULANATE 875-125 MG PO TABS: 1.0000 | ORAL_TABLET | Freq: Two times a day (BID) | ORAL | 0 refills | Status: DC

## 2016-05-13 NOTE — Patient Instructions (Signed)
Take the antibiotics twice daily as prescribed. Use lemon drops and warm compresses as described below. Return in 1-2 weeks if not improving, sooner if high fevers, increasing pain, redness, worsening symptoms.   Salivary Gland Infection A salivary gland infection is an infection in one or more of the glands that produce spit (saliva). You have six major salivary glands. Each gland has a duct that carries saliva into your mouth. Saliva keeps your mouth moist and breaks down the food that you eat. It also helps to prevent tooth decay. Two salivary glands are located just in front of your ears (parotid). The ducts for these glands open up inside your cheeks, near your back teeth. You also have two glands under your tongue (sublingual) and two glands under your jaw (submandibular). The ducts for these glands open under your tongue. Any salivary gland can become infected. Most infections occur in the parotid glands or submandibular glands. CAUSES Salivary glands can be infected by viruses or bacteria.  The mumps virus is the most common cause of viral salivary gland infections, though mumps is now rare in many areas because of vaccination.  This infection causes swelling in both parotid glands.  Viral infections are more common in children.  The bacteria that cause salivary gland infections are usually the same bacteria that normally live in your mouth.  A stone can form in a salivary gland and block the flow of saliva. As a result, saliva backs up into the salivary gland. Bacteria may then start to grow behind the blockage and cause infection.  Bacterial infections usually cause pain and swelling on one side of the face. Submandibular gland swelling occurs under the jaw. Parotid swelling occurs in front of the ear.  Bacterial infections are more common in adults. RISK FACTORS Children who do not get the MMR (measles, mumps, rubella) vaccine are more likely to get mumps, which can cause a viral  salivary gland infection. Risk factors for bacterial infections include:  Poor dental care (oral hygiene).  Smoking.  Not drinking enough water.  Having a disease that causes dry mouth and dry eyes (Mikulicz syndrome or Sjogren syndrome). SIGNS AND SYMPTOMS The main sign of salivary gland infection is a swollen salivary gland. This type of inflammation is often called sialadenitis. You may have swelling in front of your ear, under your jaw, or under your tongue. Swelling may get worse when you eat and decrease after you eat. Other signs and symptoms include:  Pain.  Tenderness.  Redness.  Dry mouth.  Bad taste in your mouth.  Difficulty chewing and swallowing.  Fever. DIAGNOSIS Your health care provider may suspect a salivary gland infection based on your signs and symptoms. He or she will also do a physical exam. The health care provider will look and feel inside your mouth to see whether a stone is blocking a salivary gland duct. You may need to see an ear, nose, and throat specialist (ENT or otolaryngologist) for diagnosis and treatment. You may also need to have diagnostic tests, such as:  An X-ray to check for a stone.  Other imaging studies to look for an abscess and to rule out other causes of swelling. These tests may include:  Ultrasound.  CT scan.  MRI.  Culture and sensitivity test. This involves collecting a sample of pus for testing in the lab to see what bacteria grow and what antibiotics they are sensitive to. The testing sample may be:  Swabbed from a salivary gland duct.  Withdrawn  from a swollen gland with a needle (aspiration). TREATMENT Viral salivary gland infections usually clear up without treatment. Bacterial infections are usually treated with antibiotic medicine. Severe infections that cause difficulty with swallowing may be treated with an IV antibiotic in the hospital. Other treatments may include:  Probing and widening the salivary duct to  allow a stone to pass. In some cases, a thin, flexible scope (endoscope) may be inserted into the duct to find a stone and remove it.  Breaking up a stone using sound waves.  Draining an infected gland (abscess) with a needle.  In some cases, you may need surgery so your health care provider can:  Remove a stone.  Drain pus from an abscess.  Remove a badly infected gland. HOME CARE INSTRUCTIONS  Take medicines only as directed by your health care provider.  If you were prescribed an antibiotic medicine, finish it all even if you start to feel better.  Follow these instructions every few hours:  Suck on a lemon candy to stimulate the flow of saliva.  Put a warm compress over the gland.  Gently massage the gland.  Drink enough fluid to keep your urine clear or pale yellow.  Rinse your mouth with a mixture of warm water and salt every few hours. To make this mixture, add a pinch of salt to 1 cup of warm water.  Practice good oral hygiene by brushing and flossing your teeth after meals and before you go to bed.  Do not use any tobacco products, including cigarettes, chewing tobacco, or electronic cigarettes. If you need help quitting, ask your health care provider. SEEK MEDICAL CARE IF:  You have pain and swelling in your face, jaw, or mouth after eating.  You have persistent swelling in any of these places:  In front of your ear.  Under your jaw.  Inside your mouth. SEEK IMMEDIATE MEDICAL CARE IF:   You have pain and swelling in your face, jaw, or mouth that are getting worse.  Your pain and swelling make it hard to swallow or breathe.   This information is not intended to replace advice given to you by your health care provider. Make sure you discuss any questions you have with your health care provider.   Document Released: 08/26/2004 Document Revised: 08/09/2014 Document Reviewed: 12/19/2013 Elsevier Interactive Patient Education [employer].

## 2016-05-13 NOTE — Progress Notes (Signed)
Chief Complaint  Patient presents with  . Facial Swelling    right side of his face (jaw) is swollen. Hurting when he chews. Started yesterday.    2-3 nights ago he noticed swelling at his right jaw, starting in front of the right ear, extending into the neck.  It is painful.  He has been using an ice pack and taking aspirin, which helps some.  It is painful to chew on the right side. He notes some clicking of his right jaw when he opens mouth wide.  Denies getting stuck or feeling unstable.He has bilateral dental implants, no recent surgery. Doesn't seem to be a tooth that hurts.  Last went to the dentist a few months ago.  Denies fever/chills.  He has some nasal congestion on the right side (chronic, since an old nasal injury).  Denies discolored mucus, has some clear, runny nose from the right side.  Slight cough, denies sore throat.   He has h/o left jaw swelling 10 years ago--he took aspirin, iced it and it resolved.  He had similar swelling on the right side 6-7 years ago, but it was painful. He doesn't recall the diagnosis or what he was treated with, thinks it was an antibiotic. Different from the left side in that it was painful.  Chart was reviewed--last seen with parotid swelling in 05/2013, treated with keflex for parotiditis  PMH, PSH, SH reviewed  Outpatient Encounter Prescriptions as of 05/13/2016  Medication Sig Note  . ALPRAZolam (XANAX) 0.25 MG tablet TAKE 1 TABLET TWICE A DAY AS NEEDED FOR ANXIETY   . atorvastatin (LIPITOR) 10 MG tablet TAKE 1 TABLET (10 MG TOTAL) BY MOUTH DAILY.   Marland Kitchen donepezil (ARICEPT) 10 MG tablet Take 1 tablet daily   . famotidine (PEPCID) 20 MG tablet TAKE 1 TABLET BY MOUTH TWICE A DAY   . memantine (NAMENDA) 5 MG tablet Take 1 tablet (5 mg total) by mouth 2 (two) times daily.   . valsartan-hydrochlorothiazide (DIOVAN-HCT) 160-12.5 MG per tablet Take 1 tablet by mouth daily.   Marland Kitchen ketoconazole (NIZORAL) 2 % cream Reported on 10/21/2015 02/19/2015: Received  from: External Pharmacy  . triamcinolone cream (KENALOG) 0.1 % Apply 1 application topically See admin instructions. Reported on 10/21/2015    No facility-administered encounter medications on file as of 05/13/2016.    Allergies  Allergen Reactions  . Ace Inhibitors Cough    Lisinopril    ROS: no fever, chills, nausea, vomiting, bowel changes, bleeding, bruising, rash, fatigue, headaches, dizziness, chest pain or other complaints except as noted in HPI  PHYSICAL EXAM:  BP 136/76 (BP Location: Left Arm, Patient Position: Sitting, Cuff Size: Normal)   Pulse 72   Temp 97.1 F (36.2 C) (Tympanic)   Ht 5\' 10"  (1.778 m)   Wt 201 lb (91.2 kg)   BMI 28.84 kg/m   Well developed, pleasant, talkative male in no distress HEENT: PERRL ,conjunctiva and sclera are clear.  OP is clear, moist mucus membranes.Teeth are nontender, normal appearing gums and buccal mucosa. Under the tongue is normal without swelling. Nasal mucosa is mildly edematous with clear mucus on the right, some crusting on the left.There is mild soft tissue swelling and tenderness anterior to the right ear.  There is no significant erythema over this area, no induration or fluctuance. No significant tenderness or clicking of the right TMJ. Neck: there is some tender lymphadenopathy on the right anterior cervical chain Heart; regular rate and rhythm without murmur Lungs: clear bilaterally Extremities: No edema  Skin: normal turgor, no lesions Neuro: alert; cranial nerves intact, normal gait Psych: normal mood, affect, hygiene and grooming.   ASSESSMENT/PLAN:  Sialoadenitis - Plan: amoxicillin-clavulanate (AUGMENTIN) 875-125 MG tablet  Differential diagnosis includes stone/obstruction, vs infection. Treat with lemon drops, warm compresses and antibiotics. F/u next week if not improving, sooner if worsening symptoms.

## 2016-05-17 ENCOUNTER — Ambulatory Visit (INDEPENDENT_AMBULATORY_CARE_PROVIDER_SITE_OTHER): Payer: Medicare Other | Admitting: Family Medicine

## 2016-05-17 DIAGNOSIS — L089 Local infection of the skin and subcutaneous tissue, unspecified: Secondary | ICD-10-CM | POA: Diagnosis not present

## 2016-05-17 DIAGNOSIS — L723 Sebaceous cyst: Secondary | ICD-10-CM

## 2016-05-17 NOTE — Progress Notes (Signed)
   Subjective:    Patient ID: Manuel [name], male    DOB: May 20, 1941, 75 y.o.   MRN: [id number]  HPI He complains of a 2 day history of discomfort and swelling to the upper back area with tenderness to palpation.   Review of Systems     Objective:   Physical Exam Am of the upper back does show a 3 cm slightly erythematous warm and tender to palpation lesion over the midportion.       Assessment & Plan:  Infected sebaceous cyst Wound was injected with Xylocaine and epinephrine. A 3 cm incision was made. The infected cyst and sac was manually removed. The wound was explored and no other sac was noted. Was packed with iodoform. History turn here in 2 days for packing removal. Of note is the fact that he has another lesion on his upper neck area but it is not hot or tender.

## 2016-05-19 ENCOUNTER — Encounter: Payer: Self-pay | Admitting: Family Medicine

## 2016-05-19 ENCOUNTER — Ambulatory Visit: Payer: Medicare Other | Admitting: Medical

## 2016-05-19 ENCOUNTER — Ambulatory Visit (INDEPENDENT_AMBULATORY_CARE_PROVIDER_SITE_OTHER): Payer: Medicare Other | Admitting: Family Medicine

## 2016-05-19 VITALS — HR 76 | Temp 97.6°F

## 2016-05-19 DIAGNOSIS — Z5189 Encounter for other specified aftercare: Secondary | ICD-10-CM

## 2016-05-19 MED ORDER — DOXYCYCLINE HYCLATE 100 MG PO TABS: 100.0000 mg | ORAL_TABLET | Freq: Two times a day (BID) | ORAL | 0 refills | Status: DC

## 2016-05-19 NOTE — Patient Instructions (Signed)
Stop the Augmentin antibiotic and start the Doxycycline. Return to see Dr. Redmond School on Friday for the wound.  If you develop fever, chills before your appointment on Friday  -Call us.

## 2016-05-19 NOTE — Progress Notes (Signed)
   Subjective:    Patient ID: Manuel [name], male    DOB: [birth year], 75 y.o.   MRN: [id number]  HPI  Chief Complaint  Patient presents with  . follow-up   He is here for follow up on a sebaceous cyst to his right upper back that was incised and drained 2 days ago. He complains of tenderness and states he has had some drainage from the area that is occasionally bloody.  He is taking Augmentin that was prescribed 6 days ago for a different issue, a dental problem.  Denies fever, chills, nausea, vomiting.    Review of Systems Pertinent positives and negatives in the history of present illness.     Objective:   Physical Exam Pulse 76   Temp 97.6 F (36.4 C) (Oral)  1 inch incision to right upper back, packing removed, wound bed is pink, no exudate. Surrounding erythema, firmness and warmth is present, no induration or fluctuance.      Assessment & Plan:  Visit for wound check  Removed the packing and irrigated the wound. Covered the area with topical bacitracin and gauze.  He will stop augmentin and start Doxycycline.  Follow up with Manuel [name] on Friday.

## 2016-05-21 ENCOUNTER — Ambulatory Visit (INDEPENDENT_AMBULATORY_CARE_PROVIDER_SITE_OTHER): Payer: Medicare Other | Admitting: Family Medicine

## 2016-05-21 DIAGNOSIS — Z5189 Encounter for other specified aftercare: Secondary | ICD-10-CM

## 2016-05-21 NOTE — Progress Notes (Signed)
   Subjective:    Patient ID: Manuel [name], male    DOB: May 29, 1941, 75 y.o.   MRN: [id number]  HPI He is here for recheck. On his last visit he was placed on an antibiotic but he decided not to take it. He states that he is having no difficulty with the wound from either drainage or discomfort.   Review of Systems     Objective:   Physical Exam Exam of his upper back does show a healing incision with no surrounding erythema warmth or tenderness.       Assessment & Plan:  Visit for wound check Recommend he continue to irrigate the area regularly and return here as needed. No need for the antibiotic.

## 2016-06-03 ENCOUNTER — Telehealth: Payer: Self-pay | Admitting: Family Medicine

## 2016-06-03 NOTE — Telephone Encounter (Signed)
Left message for pt to call. Pt does not need to keep appt for Monday. I went ahead and canceled that one. However per [facility] he does need an appt in about of month from now.

## 2016-06-04 NOTE — Telephone Encounter (Signed)
Call pt and informed him that he did not need to come in on Monday. He was informed that he did need to make an appt for a month from now. Pt was unable to make appt then. I informed pt I would call him back on Monday and schedule.

## 2016-06-07 ENCOUNTER — Encounter: Payer: Self-pay | Admitting: Family Medicine

## 2016-06-07 ENCOUNTER — Telehealth: Payer: Self-pay

## 2016-06-07 ENCOUNTER — Ambulatory Visit: Payer: Medicare Other | Admitting: Family Medicine

## 2016-06-07 ENCOUNTER — Other Ambulatory Visit: Payer: Self-pay | Admitting: Family Medicine

## 2016-06-07 ENCOUNTER — Ambulatory Visit (INDEPENDENT_AMBULATORY_CARE_PROVIDER_SITE_OTHER): Payer: Medicare Other | Admitting: Family Medicine

## 2016-06-07 VITALS — BP 120/78 | HR 83 | Resp 16 | Wt 198.6 lb

## 2016-06-07 DIAGNOSIS — I10 Essential (primary) hypertension: Secondary | ICD-10-CM

## 2016-06-07 DIAGNOSIS — F0390 Unspecified dementia without behavioral disturbance: Secondary | ICD-10-CM | POA: Diagnosis not present

## 2016-06-07 DIAGNOSIS — G3184 Mild cognitive impairment, so stated: Secondary | ICD-10-CM | POA: Diagnosis not present

## 2016-06-07 DIAGNOSIS — F03A0 Unspecified dementia, mild, without behavioral disturbance, psychotic disturbance, mood disturbance, and anxiety: Secondary | ICD-10-CM

## 2016-06-07 MED ORDER — MEMANTINE HCL 5 MG PO TABS: 5.0000 mg | ORAL_TABLET | Freq: Two times a day (BID) | ORAL | 1 refills | Status: DC

## 2016-06-07 MED ORDER — DONEPEZIL HCL 10 MG PO TABS: ORAL_TABLET | ORAL | 1 refills | Status: AC

## 2016-06-07 NOTE — Telephone Encounter (Signed)
Called and spoke with Helene Kelp at [facility]- She states that pt qualifies for their services. It will be 7-10 days before they get a nurse out to his home. She did mark pt high priority d/t confusion.   Called in Roberdel and Aricept 90 day with 1 refill. Spoke with pharmacist will go ahead to get these things ready. Advised pharmacist to fill 90 day supply. Advised pt he will pick up three scripts today- aricept, namenda, and diovan/hctz. Pt verbalized understanding.

## 2016-06-07 NOTE — Patient Instructions (Addendum)
Take the Aricept (donepezil) and Namenda (memantine) daily as directed. Make sure the nurse comes out to visit to make sure that you are on the right medicines

## 2016-06-07 NOTE — Addendum Note (Signed)
Addended by: Arley Phenix L on: 06/07/2016 11:30 AM   Modules accepted: Orders

## 2016-06-07 NOTE — Addendum Note (Signed)
Addended by: Arley Phenix L on: 06/07/2016 11:08 AM   Modules accepted: Orders

## 2016-06-07 NOTE — Progress Notes (Signed)
   Subjective:    Patient ID: Manuel [name], male    DOB: March 01, 1941, 75 y.o.   MRN: [id number]  HPI He is here for follow-up concerning his medications. He apparently did run out of the [place] and is not sure why he didn't get it refilled. He does remember that he is to be taking it twice per day. Review of the neurology note indicates he supposed to be having a nurse come visit to ensure medication management but he states that this has not occurred.   Review of Systems     Objective:   Physical Exam Alert and in no distress. He did tend to repeat himself.       Assessment & Plan:  Mild cognitive impairment I will have my nurse check on his dosing and make sure that he has both Namenda and Aricept with 90 day supply. He will return here in 4 months. She will also check with [facility] to make sure that a nursing visit is scheduled.

## 2016-06-08 ENCOUNTER — Other Ambulatory Visit: Payer: Self-pay | Admitting: Family Medicine

## 2016-06-08 ENCOUNTER — Other Ambulatory Visit: Payer: Self-pay

## 2016-06-08 NOTE — Telephone Encounter (Signed)
Is this okay to refill? 

## 2016-06-08 NOTE — Telephone Encounter (Signed)
Called in per jcl 

## 2016-06-08 NOTE — Telephone Encounter (Signed)
Okay 

## 2016-06-08 NOTE — Patient Outreach (Addendum)
Manuel [name]) Care Management  06/08/2016  Manuel [name] [birth year] [id number]   Telephonic Screening and Initial Assessment    Referral Date:  06/07/16 Source:  MD office  Issue:  Urgent request Insurance:  [name]  Providers: Primary MD: Dr. Jill [name]  -  last appt: 06/07/2016    next appt: 4 months (10/2015) HH: Last [place] order 04/07/16.  Psycho/Social: 75 yo male living in his home with his wife of 46 years who has PD.   Ambulates with no assistive devices.  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  Past Paediatric nurse.  Mows own grass and stays active.  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  Falls: last fall date:  08/2015 -  fell in the snow; slipped and injured knee. Pain:  none Depression: none Transportation:  self Caregiver: wife Emergency Contact:  Wife and Wife's sister's, Manuel [name] and Manuel [name].  Advance Directive:  Yes - completed 2016.  Wife, daughter, son.  Consent:  yes Resources: none DME: BP cuff, scales, eyeglasses.   Co-morbidities:  Mild dementia,  Essential HTN, Aortic atherosclerosis, OSA, Hyperlipidemia, Right hand weakness, GERD, Psoriasis  Admissions:  0 ED visits:  1 12/27/15  BP 136/76 05/13/2016 Weight 201 lb (91 kg) 05/13/2016 Height 70 in (178 cm) 05/13/2016 BMI 28.90 (Overweight, Pre-obe 05/13/2016  Lipid Panel completed 09/03/2014 HDL 58.000 09/03/2014 LDL 160.000 09/03/2014 Cholesterol, total 247.000 09/03/2014 Triglycerides 145.000 09/03/2014 A1C 5.600 01/07/2014 Glucose Random 85.000 09/03/2014   Mild Dementia: H/o patient failed to get refill on Namenda.  Dr. Redmond [name] ordered refill on Namenda and Aricept with 90 day supply on 06/07/2016.  Patient states he is going to pick the medication up today.    Medications:  Patient taking  less than 15 medications  Co-pay cost issues:  None  Prevnar (PCV13) 11/29/2013 Pneumovax (PP  06/17/2009 Flu Vaccine 03/31/2016 tDAP Vaccine 12/27/2015 Medication Reconciliation completed with patient 06/08/2016 Patient takes medications from med bottles and wife will assist with med management at times.  [facility]  Patient unable to state reason for  Doxycyline RX.   Encounter Medications:  Outpatient Encounter Prescriptions as of 06/08/2016  Medication Sig Note  . ALPRAZolam (XANAX) 0.25 MG tablet TAKE 1 TABLET TWICE A DAY AS NEEDED FOR ANXIETY   . atorvastatin (LIPITOR) 10 MG tablet TAKE 1 TABLET (10 MG TOTAL) BY MOUTH DAILY.   Marland Kitchen donepezil (ARICEPT) 10 MG tablet Take 1 tablet daily   . doxycycline (VIBRA-TABS) 100 MG tablet Take 1 tablet (100 mg total) by mouth 2 (two) times daily.   . famotidine (PEPCID) 20 MG tablet TAKE 1 TABLET BY MOUTH TWICE A DAY   . ketoconazole (NIZORAL) 2 % cream Reported on 10/21/2015 02/19/2015: Received from: External Pharmacy  . memantine (NAMENDA) 5 MG tablet Take 1 tablet (5 mg total) by mouth 2 (two) times daily.   Marland Kitchen triamcinolone cream (KENALOG) 0.1 % Apply 1 application topically See admin instructions. Reported on 10/21/2015   . valsartan-hydrochlorothiazide (DIOVAN-HCT) 160-12.5 MG tablet TAKE 1 TABLET BY MOUTH DAILY.    No facility-administered encounter medications on file as of 06/08/2016.     Functional Status:  In your present state of health, do you have any difficulty performing the following activities: 06/08/2016  Hearing? N  Vision? N  Difficulty concentrating or making decisions? Y  Walking or climbing stairs? N  Dressing or bathing?  N  Doing errands, shopping? N  Preparing Food and eating ? N  Using the Toilet? N  In the past six months, have you accidently leaked urine? N  Do you have problems with loss of bowel control? N  Managing your Medications? Y  Managing your Finances? N  Housekeeping or managing your Housekeeping? N  Some recent data might be hidden    Fall/Depression Screening: PHQ 2/9 Scores  06/08/2016 04/18/2015 02/25/2014 08/11/2011  PHQ - 2 Score 0 0 0 0    Fall Risk  06/08/2016 04/07/2016 04/07/2016 04/18/2015 02/25/2014  Falls in the past year? Yes Yes No No No  Number falls in past yr: 1 2 or more - - -  Injury with Fall? Yes No - - -  Risk for fall due to : Medication side effect - - - -  Follow up Falls evaluation completed;Education provided;Falls prevention discussed - - - -    Preventives: Hearing: screening in the past  Eyes: yearly exam; wears glasses Dentist: yes - Dental Implants Colonoscopy 01/16/2009    Assessment / Plan:  Referral Date:  06/07/2016 Screening and Initial Assessment:  06/08/2016 Telephonic RN CM services:  06/08/2016 Program:  Other:  Mild Dementia  THN Telephonic RN CM Referral  (Patient is not home bound and is in-eligible for [name] services).    RN CM provided education on eligibility for [facility] services.   RN CM provided education on [name] Telephonic and Community services which would not require same guidelines as HH.  RN CM advised that RN CM will follow telephonically to monitor medication adherence and disease management.  Patient agreed to services.   RN CM reviewed medications and requested patient to write down by the medication name reason patient takes medication (which patient did).  RN CM suggested a medication box to assist with easier medication management and avoid taking medication from the wrong bottle. RN CM will monitor medication adherence and ability to self manage medications.  Patient unable to state reason he is taking Doxycyline; RN CM reviewed MR and verified prescribed on 05/19/16 for sebaceous cyst right upper back incised and drained 05/17/16.  Patient reported on 05/21/16 appt he did not take due to elected not to take.  MD approved following exam on 05/21/16.  Patient is not taking this medication. (Resolved)  RN CM advised in next [facility] scheduled contact call within next 30 days for monthly assessment and care coordination  services as needed. RN CM advised to please notify MD of any changes in condition prior to scheduled appt's.   RN CM provided contact name and # [phone] or main office # [phone] and 24-hour nurse line # [phone].  RN CM confirmed patient is aware of 911 services for urgent emergency needs.  RN CM sent successful outreach letter and  [name] Introductory package. RN CM sent Physician Enrollment/Barriers Letter and Initial Assessment to Primary MD RN CM notified [place] Management Assistant: agreed to services/case opened.  [facility] CM Care Plan Problem One   Flowsheet Row Most Recent Value  Care Plan Problem One  Knowledge deficit associated to self mangement interventions for dementia.   Role Documenting the Problem One  Care Management Telephonic [place] for Problem One  Active  THN Long Term Goal (31-90 days)  Patient will identify self management interventions to provide safe self care in the home over the next 31-90 days.   THN Long Term Goal Start Date  06/08/16  Interventions for Problem One Long Term  Goal  RN CM will provide self management tips for memory deficit over the next 31-90 days.   THN CM Short Term Goal #1 (0-30 days)  Patient will write down reasons he takes his medications and what condition they manage over the next 30 days.   THN CM Short Term Goal #1 Start Date  06/08/16  Interventions for Short Term Goal #1  RN CM will provide education on medications and related condition over the next 30 days.     THN CM Care Plan Problem Two   Flowsheet Row Most Recent Value  Care Plan Problem Two  Medication adherence:  failure to get medication refills.   Role Documenting the Problem Two  Care Management Telephonic Coordinator  Care Plan for Problem Two  Active  THN CM Short Term Goal #1 (0-30 days)  Patient will pick up prescription refills over the next 1-2 days.   THN CM Short Term Goal #1 Start Date  06/08/16  Interventions for Short Term Goal #2    RN CM will provide education on importance of getting medication refills over the next 30 days.     [name] CM Care Plan Problem Three   Flowsheet Row Most Recent Value  Care Plan Problem Three  Knowledge deficit associated to medication self management.   Role Documenting the Problem Three  Care Management Telephonic Coordinator  Care Plan for Problem Three  Active  THN CM Short Term Goal #1 (0-30 days)  Patient will consider a medication box over the next 30 days.   THN CM Short Term Goal #1 Start Date  06/08/16  Interventions for Short Term Goal #1  RN CM will provide education on resource, medication box to avoid taking wrong medications and getting refills on time over the next 30 days.       [name], BSN, RN, [place] Management Care Management Coordinator [phone] Direct [phone] Cell [phone] Office [phone] Fax [email or web address]

## 2016-06-28 ENCOUNTER — Telehealth: Payer: Self-pay | Admitting: Family Medicine

## 2016-06-28 NOTE — Telephone Encounter (Signed)
Per [name] appt made with son to be present to discuss assisted living.

## 2016-06-28 NOTE — Telephone Encounter (Signed)
Pt's Manuel [name], requesting to speak to Dr Redmond School regarding pt. [name] is concerned about pt. [name] can be reached at [phone]

## 2016-07-06 ENCOUNTER — Other Ambulatory Visit: Payer: Self-pay

## 2016-07-06 NOTE — Patient Outreach (Addendum)
[facility]) Care Management  07/06/2016  HUY KRIES [birth year] [id number]   Telephonic Monthly Assessment  Referral Date:  06/07/16 Telephonic RN CM services:  06/08/2016 Program:  Other:  Mild Dementia Insurance:  [employer]  [address]  [place]  [address] [phone] Ammie Ferrier (M)  Outreach call #1 to patient for Telephonic Monthly Assessment.  Patient not reached at home or cell number.  RN CM left HIPAA compliant voice messages at both numbers.   RN CM scheduled for next outreach call within one week.   [name], BSN, RN, [place] Care Management Care Management Coordinator [phone] Direct [phone] Cell [phone] Office [phone] Fax [email or web address]

## 2016-07-07 ENCOUNTER — Ambulatory Visit: Payer: Self-pay

## 2016-07-07 ENCOUNTER — Ambulatory Visit (INDEPENDENT_AMBULATORY_CARE_PROVIDER_SITE_OTHER): Payer: Medicare Other | Admitting: Family Medicine

## 2016-07-07 DIAGNOSIS — G3184 Mild cognitive impairment, so stated: Secondary | ICD-10-CM | POA: Diagnosis not present

## 2016-07-07 NOTE — Progress Notes (Signed)
   Subjective:    Patient ID: Manuel [name], male    DOB: Nov 03, 1940, 75 y.o.   MRN: [id number]  HPI He is here with his son and wife. I have been in contact with his son concerning the overall health of both of them. He has major concerns over his father's cognitive abilities as well as driving. He becomes quite forgetful with multiple issues including taking his medications. There has also been questionable driving skills. He recently did go to [place] to visit his son but then had difficulties with fatigue and his memory. His wife is also having neurologic issues interfering with her functional ability.   Review of Systems     Objective:   Physical Exam Alert and in no distress otherwise not examined       Assessment & Plan:  Mild cognitive impairment Both he and his wife are now having difficulty with slow decline in her overall health and well-being. I discussed in detail with all 3 of them the need to look into assisted living which his son and I have talked about in the past. Married and his wife are resigned to this. I explained that sometimes staying in the house can be the worse thing going. We need to make sure that he is taking his medications appropriately and if he needs physical as well as OT type help it will be available for him and his wife. They will keep me informed concerning where he ends up. Also discussed the fact that driving is not a good option. I did recommend that he get tested and a note was faxed to the [facility]. Over 25 minutes spent in counseling concerning this.

## 2016-07-08 ENCOUNTER — Ambulatory Visit: Payer: Self-pay

## 2016-07-09 ENCOUNTER — Ambulatory Visit: Payer: Self-pay

## 2016-07-12 ENCOUNTER — Ambulatory Visit: Payer: Self-pay

## 2016-07-14 ENCOUNTER — Ambulatory Visit: Payer: Self-pay

## 2016-07-15 ENCOUNTER — Other Ambulatory Visit: Payer: Self-pay

## 2016-07-15 NOTE — Patient Outreach (Signed)
[facility]) Care Management  07/15/2016  CHEN COXE 1941-01-22 [id number]   Telephonic Screening and Initial Assessment    Referral Date:  06/07/16 Source:  MD office  Issue:  Urgent request Insurance:  [facility]  Providers: Primary MD: Dr. Jill Alexanders  -  last appt: 07/07/2016   HH: Last [place] order 04/07/16.  Psycho/Social: 75 yo male living in his home with his wife of 67 years who has PD.   Ambulates with no assistive devices.  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  Past Paediatric nurse.  Mows own grass and stays active.  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  Falls: last fall date:  08/2015 -  fell in the snow; slipped and injured knee. Pain:  none Depression: none Transportation:  self Caregiver: wife Emergency Contact:  Wife and Wife's sister's, Consuella Lose and Reggie Pile.  Advance Directive:  Yes - completed 2016.  Wife, daughter, son.  Consent:  yes Resources: none DME: BP cuff, scales, eyeglasses.   Co-morbidities:  Mild dementia,  Essential HTN, Aortic atherosclerosis, OSA, Hyperlipidemia, Right hand weakness, GERD, Psoriasis  Admissions:  0 ED visits:  1 12/27/15  BP 120/78 06/07/2016 Weight 198 lb (90 kg) 06/08/2016  (Weight down 3 lbs over the past one month) Height 70 in (178 cm) 06/08/2016 BMI 28.50 (Overweight, Pre-obe 06/08/2016  Lipid Panel completed 09/03/2014 HDL 58.000 09/03/2014 LDL 160.000 09/03/2014 Cholesterol, total 247.000 09/03/2014 Triglycerides 145.000 09/03/2014 A1C 5.600 01/07/2014 Glucose Random 85.000 09/03/2014   Mild cognitive impairment Patient and wife have increase level of care needs in the home.  H/o Dr. Jill Alexanders has  discussed in detail with patient, wife and son  the need to look into assisted living.    Patient needs medication management assistance.   H/o Dr. Redmond School discussed the fact that driving is not a good option and  recommended that patient get tested;  and a [facility] was faxed to the [facility].    Medications:  Patient taking  less than 15 medications  Co-pay cost issues:  None  Prevnar (PCV13) 11/29/2013 Pneumovax (PP 06/17/2009 Flu Vaccine 03/31/2016 tDAP Vaccine 12/27/2015 Medication Reconciliation completed with patient 06/08/2016 Patient takes medications from med bottles and wife will assist with med management at times.  [facility]  Patient unable to state reason for  Doxycyline RX.   Encounter Medications:  Outpatient Encounter Prescriptions as of 07/15/2016  Medication Sig Note  . ALPRAZolam (XANAX) 0.25 MG tablet TAKE 1 TABLET BY MOUTH TWICE A DAY AS NEEDED FOR ANXIETY   . atorvastatin (LIPITOR) 10 MG tablet TAKE 1 TABLET (10 MG TOTAL) BY MOUTH DAILY.   Marland Kitchen donepezil (ARICEPT) 10 MG tablet Take 1 tablet daily   . doxycycline (VIBRA-TABS) 100 MG tablet Take 1 tablet (100 mg total) by mouth 2 (two) times daily. (Patient not taking: Reported on 06/08/2016)   . famotidine (PEPCID) 20 MG tablet TAKE 1 TABLET BY MOUTH TWICE A DAY   . ketoconazole (NIZORAL) 2 % cream Reported on 10/21/2015 02/19/2015: Received from: External Pharmacy  . memantine (NAMENDA) 5 MG tablet Take 1 tablet (5 mg total) by mouth 2 (two) times daily.   Marland Kitchen triamcinolone cream (KENALOG) 0.1 % Apply 1 application topically See admin instructions. Reported on 10/21/2015   . valsartan-hydrochlorothiazide (DIOVAN-HCT) 160-12.5 MG tablet TAKE 1 TABLET BY MOUTH DAILY.    No facility-administered encounter medications  on file as of 07/15/2016.     Functional Status:  In your present state of health, do you have any difficulty performing the following activities: 06/08/2016  Hearing? N  Vision? N  Difficulty concentrating or making decisions? Y  Walking or climbing stairs? N  Dressing or bathing? N  Doing errands, shopping? N  Preparing Food and eating ? N  Using the Toilet? N  In the past six months, have you accidently  leaked urine? N  Do you have problems with loss of bowel control? N  Managing your Medications? Y  Managing your Finances? N  Housekeeping or managing your Housekeeping? N  Some recent data might be hidden    Fall/Depression Screening: PHQ 2/9 Scores 75/01/2016 04/18/2015 02/25/2014 08/11/2011  PHQ - 2 Score 0 0 0 0    Fall Risk  06/08/2016 04/07/2016 04/07/2016 04/18/2015 02/25/2014  Falls in the past year? Yes Yes No No No  Number falls in past yr: 1 2 or more - - -  Injury with Fall? Yes No - - -  Risk for fall due to : Medication side effect - - - -  Follow up Falls evaluation completed;Education provided;Falls prevention discussed - - - -    Preventives: Hearing: screening in the past  Eyes: yearly exam; wears glasses Dentist: yes - Dental Implants Colonoscopy 01/16/2009   Assessment / Plan:  Referral Date:  06/07/2016 Screening and Initial Assessment:  06/08/2016 Telephonic RN CM services:  06/08/2016 Program:  Other:  Mild Dementia  THN Telephonic RN CM Referral  RN CM reviewed medications and requested patient to write down by the medication name reason patient takes medication (which patient did).  RN CM suggested a medication box to assist with easier medication management and avoid taking medication from the wrong bottle.(which patient did).  RN CM will monitor medication adherence and ability to self manage medications.   [facility] Referral  -Medication safety concerns due to poor memory and mild cognitive impairment  -difficulty with self management of medications -medication side effects of drowsiness -patient would benefit from home visit and one on one medication reconciliation assessment and education.   [facility] Referral  -Education and resources for placement assistance needed.  -ALF verses LTC option resources -Medication safety concerns due to poor memory and mild cognitive impairment  Brewing technologist CM advised in next [facility] scheduled contact call  within next 30 days for monthly assessment and care coordination services as needed. RN CM advised to please notify MD of any changes in condition prior to scheduled appt's.   RN CM provided contact name and # [phone] or main office # [phone] and 24-hour nurse line # [phone].  RN CM confirmed patient is aware of 911 services for urgent emergency needs.  [name], BSN, RN, [place] Care Management Care Management Coordinator [phone] Direct  [phone] Cell [phone] Office [phone] Fax [email or web address]

## 2016-07-16 ENCOUNTER — Encounter: Payer: Self-pay | Admitting: *Deleted

## 2016-07-20 ENCOUNTER — Telehealth: Payer: Self-pay | Admitting: Family Medicine

## 2016-07-20 NOTE — Telephone Encounter (Signed)
Pt called very upset about receiving a letter Dept of Transportation, Division of Motor Vehicle requesting that he report to [facility] in [place] for exam on 12/21 at 2:30 to determine if he can keep his driver's license. Pt has been trying to call that office to make sure that he has everything that he needs since letter mentions a fee and pt wants to make his failure to appear or items he should have is not the reason his license could get taken. He wants to know if Dr Redmond School knows how this process works and why he is being asked to take the exam.

## 2016-07-20 NOTE — Telephone Encounter (Signed)
Let him know that I am not sure how the process works but that we are all concerned over his driving skills and wanted to make sure that he is a safe driver

## 2016-07-22 NOTE — Telephone Encounter (Signed)
Called pt reached voice mail [email or web address]. 

## 2016-07-23 ENCOUNTER — Telehealth: Payer: Self-pay

## 2016-07-23 NOTE — Telephone Encounter (Signed)
Manuel [name]- (pt's son) called to let you know about the situation with the driving evaluation that has been requested. He just wants to fill you in on some information. He request a call from you at [phone]. He said it was not urgent and that you can call him after the holidays. [name]

## 2016-07-27 ENCOUNTER — Other Ambulatory Visit: Payer: Self-pay | Admitting: *Deleted

## 2016-07-27 NOTE — Patient Outreach (Signed)
[facility]) Care Management  07/27/2016  HURBERT WATERFORD 05/05/1941 [id number]   CSW received a new referral on patient from Mariann Laster, Telephonic Nurse Case Manager with [place] [facility], indicating that patient would benefit from the following social work services and resources: Arts administrator - Psychiatrist) Engineer, mining; Armed forces technical officer through [employer] and application. Long-Term Care Placement Resources - Assisted Living Facilities in [place]; Request for completion of [place]-2 Form for patient and wife, Kalani Laity; Administration of TB Skin Test for patient and wife, Brand Alemany. According to Mrs. Hutchinson, patient has a history of mild cognitive impairment, with a need for increased level of care and supervision, for both he and his wife, Sheikh Homewood, which has been discussed with them and their son, Rexall Maybank.  Mrs. Hutchinson further reported that Dr. Jill Alexanders, patient's Primary Care Physician, has discussed with patient the fact that driving is no longer a valid option, requesting that patient be tested through the [facility] (Division of Motor Vehicles).  Patient's RNCM with [place] Management, Kathie Rhodes will address medication management concerns with patient. CSW made an initial attempt to try and contact patient today to perform phone assessment, as well as assess and assist with social needs and services, without success.  A HIPAA complaint message was left for patient on voicemail.  CSW is currently awaiting a return call.  CSW will make a second outreach attempt to patient within the next week, if CSW does not receive a return call from patient in the meantime. [name], BSW, MSW, LCSW  Licensed Education officer, environmental Health System  Mailing [place]  [address] [place], [place] [address] Physical Address-[address], [place], [place] [address] Toll Free Main # [phone] Fax # [phone] Cell # [phone]  Fax # [phone]  [email or web address]

## 2016-08-03 ENCOUNTER — Ambulatory Visit: Payer: Medicare Other

## 2016-08-09 ENCOUNTER — Other Ambulatory Visit: Payer: Self-pay | Admitting: *Deleted

## 2016-08-09 NOTE — Patient Outreach (Signed)
[facility]) Care Management  08/09/2016  Manuel [name] 05-Aug-1940 [id number]   CSW made a second attempt to try and contact patient today to perform phone assessment, as well as assess and assist with social needs and services, without success.  A HIPAA complaint message was left for patient on voicemail.  CSW is currently awaiting a return call. Shortly thereafter, CSW received a return call from patient, but CSW was unavailable.  Patient left a message for CSW but when CSW went to return patient's call, patient was unavailable and CSW was unable to leave a message.  CSW will continue to try and make contact with patient. [name], BSW, MSW, LCSW  Licensed Education officer, environmental Health System  Mailing [place] [address] [place], [place] [address] Physical Address-[address], [place], [place] [address] Toll Free Main # [phone] Fax # [phone] Cell # [phone]  Fax # [phone]  [email or web address]

## 2016-08-10 ENCOUNTER — Telehealth: Payer: Self-pay | Admitting: Family Medicine

## 2016-08-10 NOTE — Patient Outreach (Signed)
[facility]) Care Management  08/10/2016  Manuel [name] 10/04/1940 [id number]   Request received from [name], LCSW to mail patient SCAT application, Senior Resources information, Assisted Living Facilities, ACE and PACE brochure, welcome packet and consent.   Jacqulynn Cadet  [facility] Care Management Assistant

## 2016-08-10 NOTE — Telephone Encounter (Signed)
Pt's son called and stated that he had a form to be completed for [facility]. Please complete and return to [name] so it can be forwarded via email.

## 2016-08-12 ENCOUNTER — Other Ambulatory Visit: Payer: Self-pay

## 2016-08-12 NOTE — Patient Outreach (Signed)
[facility]) Care Management  08/12/2016  KHAZI BRECKENRIDGE [birth year] [id number]   Telephonic Monthly Assessment   Outreach call #1 to home # [phone] :  "Number has been changed and no new number provided" Additional same day attempt to mobile # [phone]. RN CM left HIPAA compliant voice message with name and number for call back.  RN CM scheduled for next outreach call within one week.   [name], BSN, RN, [place] Care Management Care Management Coordinator [phone] Direct [phone] Cell [phone] Office [phone] Fax [email or web address]

## 2016-08-13 ENCOUNTER — Other Ambulatory Visit: Payer: Self-pay | Admitting: Pharmacist

## 2016-08-13 ENCOUNTER — Ambulatory Visit: Payer: Self-pay

## 2016-08-13 NOTE — Patient Outreach (Signed)
[facility]) Care Management  08/13/2016  KABEER HAW June 10, 1941 [id number]   76 year old male referred to [place] for medication adherence due to medication confusion from cognitive impairment/dementia. Called patient to discuss medications.    Patient states he is moving into a new facility (assisted living facility).  He states his wife does have Parkinson's Disease and the new facility will help him.  Patient does state that he forgets the names of different things and has some memory issues. Patient has some obvious memory issues via telephone and he was not at home currently to review his medications.    Plan: Followup home visit for next week to assess medication adherence.  Did not review medications over the phone today as patient was not currently home.   Will meet patient at his new assisted living facility [place] [facility] [phone].  Will plan to call patient that morning to remind him of our visit.   Bennye Alm, PharmD, [place] PGY2 Pharmacy Resident [phone]

## 2016-08-16 ENCOUNTER — Other Ambulatory Visit: Payer: Self-pay | Admitting: *Deleted

## 2016-08-16 ENCOUNTER — Other Ambulatory Visit: Payer: Self-pay

## 2016-08-16 ENCOUNTER — Encounter: Payer: Self-pay | Admitting: *Deleted

## 2016-08-16 NOTE — Patient Outreach (Addendum)
[facility]) Care Management  08/16/2016  Manuel [name] Oct 19, 1940 [id number]   CSW made a third and final attempt to try and contact patient today to perform phone assessment, as well as assess and assist with social work needs and services, without success.  CSW was unable to leave a HIPAA compliant message for patient, as patient's phone number indicated that it had been disconnected and was no longer in service.  After thorough review of patient's chart, CSW noted that patient and his wife, Manuel [name] recently moved into an [place], [place] at [facility], Room # [phone].  CSW CSW will mail an outreach letter to patient at [employer], encouraging patient to contact CSW at his earliest convenience, if patient is interested in receiving social work services through [place] with [facility].  If CSW does not receive a return call from patient within the next 10 business days, CSW will proceed with case closure.  Required number of phone attempts will have been made and outreach letter mailed.   [name], BSW, MSW, LCSW  Licensed Education officer, environmental Health System  Mailing [place] [address], [place], [place] [address] Physical Address-[address], [place], [place] [address] Toll Free Main # [phone] Fax # [phone] Cell # [phone]  Fax # [phone]  [name]

## 2016-08-16 NOTE — Patient Outreach (Signed)
  [facility] [facility]) Care Management  08/16/2016  Manuel [name] August 17, 1940 [id number]   Telephonic Monthly Assessment   Outreach call #2 to home # [phone] home  :  "Number has been changed and no new number provided" Additional same day attempt to mobile # [phone] mobile. H/o plans for ALF:  [facility] [phone]. RN CM left HIPAA compliant voice message with name and number for call back.  RN CM scheduled for next outreach call within one week.   [name], BSN, RN, [place] Management Care Management Coordinator [phone] Direct [phone] Cell [phone] Office [phone] Fax [email or web address]

## 2016-08-17 ENCOUNTER — Other Ambulatory Visit: Payer: Self-pay

## 2016-08-17 NOTE — Patient Outreach (Signed)
  [facility]) Care Management  08/17/2016  Manuel [name] June 02, 1941 [id number]   Telephonic Monthly Assessment   Outreach call #3 to home # [phone] home:  "Number has been changed and no new number provided" Additional same day attempt to mobile # [phone] mobile.  RN CM left HIPAA compliant voice message with name and number for call back.  H/o plans for ALF:  [facility] [phone]. Unsuccessful outreach letter mailed to patient.   RN CM will close case within 2 weeks if no response received.   Manuel [name], BSN, RN, [place] Management Care Management Coordinator [phone] Direct [phone] Cell [phone] Office [phone] Fax [name]

## 2016-08-18 ENCOUNTER — Other Ambulatory Visit: Payer: Self-pay | Admitting: Pharmacist

## 2016-08-18 ENCOUNTER — Ambulatory Visit: Payer: Self-pay | Admitting: Pharmacist

## 2016-08-18 NOTE — Patient Outreach (Signed)
[facility]) Care Management  08/18/2016  RACER BREAN 31-Aug-1940 [id number]   76 year old male referred to [place] for medication adherence due to medication confusion from cognitive impairment/dementia. Called patient today to reschedule home visit due to to adverse weather conditions.  Have rescheduled home visit for Friday.  Will assess medication adherence/management during home visit and also introduce patient to [place] including social work and nursing as they have had unsuccessful outreach attempts to patient.    Bennye Alm, PharmD, [place] PGY2 Pharmacy Resident [phone]

## 2016-08-20 ENCOUNTER — Ambulatory Visit: Payer: Self-pay | Admitting: Pharmacist

## 2016-08-30 ENCOUNTER — Other Ambulatory Visit: Payer: Self-pay | Admitting: *Deleted

## 2016-08-30 NOTE — Patient Outreach (Signed)
[place] [facility]) Care Management  08/30/2016  Manuel [name] 09-06-1940 [id number]   CSW made a final attempt to try and contact patient today at both phone numbers provided, without success.  CSW received an automated recording at [phone] stating, "The number you called has been changed, the new number is unknown".  CSW then tried calling patient at [phone] and received patient's voicemail.  CSW left a HIPAA compliant message for patient, encouraging patient to return CSW's call at his earliest convenience if he is interested in receiving social work services. Meanwhile, CSW will perform a case closure on patient, due to inability to establish initial phone contact, despite required number of phone attempts made and outreach letter mailed to patient's home.  CSW will notify patient's RNCM with [place] Management, Manuel [name] of CSW's plans to close patient's case.  CSW will fax an update to patient's Primary Care Physician, Manuel [name] to ensure that they are aware of CSW's involvement with patient's plan of care.  CSW will submit a case closure request to Manuel [name], Care Management Assistant with [place] Management, in the form of an [employer].  CSW will ensure that Manuel [name] is aware of [place]'s, RNCM with [place] Management, continued involvement with patient's care. Manuel [name], Manuel [name], Manuel [name], Manuel [name]  Licensed Education officer, environmental Health System  Mailing [place] [address] [place], [place] [address] Physical Address-[address], [place], [place] [address] Toll Free Main # [phone] Fax # [phone] Cell # [phone]  Office # [phone] [name]

## 2016-08-31 ENCOUNTER — Other Ambulatory Visit: Payer: Self-pay

## 2016-08-31 NOTE — Patient Outreach (Addendum)
  [facility]) Care Management  08/31/2016  Manuel [name] 1941-03-12 [id number]   RN CM unable to reach patient with 3 phone attempts and unsuccessful outreach letter. Case closed to this RN CM discipline.  [facility] Pharmacist remains active with case.   RN CM notified Pharmacist of this update.   Manuel [name], BSN, RN, [place] Management Care Management Coordinator [phone] Direct [phone] Cell [phone] Office [phone] Fax [name]

## 2016-09-01 ENCOUNTER — Encounter: Payer: Self-pay | Admitting: Pharmacist

## 2016-09-01 ENCOUNTER — Other Ambulatory Visit: Payer: Self-pay | Admitting: Pharmacist

## 2016-09-07 ENCOUNTER — Ambulatory Visit: Payer: Medicare Other

## 2016-09-09 ENCOUNTER — Ambulatory Visit: Payer: Self-pay | Admitting: *Deleted

## 2016-09-12 NOTE — Patient Outreach (Signed)
[facility]) Care Management  [place]  09/01/2016  Manuel [name] [birth year] [id number]  Subjective: 76 year old male referred to [facility] for medication adherence.  [facility] pharmacy saw patient in his home today for pharmacist home visit.  Patient reports adherence to his medications.  All medications were reviewed with patient.  Patient does not use pill box and is not interested in a pill box at this time.  He states he does not have trouble remembering to take his medications and has no health complaints at this time.   Patient denies shortness of breath or chest pain.    Objective:  Vitals:   09/01/16 0947  BP: 127/85  Pulse: 79    Encounter Medications: Outpatient Encounter Prescriptions as of 09/01/2016  Medication Sig Note  . ALPRAZolam (XANAX) 0.25 MG tablet TAKE 1 TABLET BY MOUTH TWICE A DAY AS NEEDED FOR ANXIETY   . atorvastatin (LIPITOR) 10 MG tablet TAKE 1 TABLET (10 MG TOTAL) BY MOUTH DAILY.   Marland Kitchen donepezil (ARICEPT) 10 MG tablet Take 1 tablet daily   . famotidine (PEPCID) 20 MG tablet TAKE 1 TABLET BY MOUTH TWICE A DAY   . memantine (NAMENDA) 5 MG tablet Take 1 tablet (5 mg total) by mouth 2 (two) times daily.   . NON FORMULARY Take 1 capsule by mouth 2 (two) times daily. FDGard   . valsartan-hydrochlorothiazide (DIOVAN-HCT) 160-12.5 MG tablet TAKE 1 TABLET BY MOUTH DAILY.   . [DISCONTINUED] triamcinolone cream (KENALOG) 0.1 % Apply 1 application topically See admin instructions. Reported on 10/21/2015   . [DISCONTINUED] doxycycline (VIBRA-TABS) 100 MG tablet Take 1 tablet (100 mg total) by mouth 2 (two) times daily. (Patient not taking: Reported on 07/15/2016)   . [DISCONTINUED] ketoconazole (NIZORAL) 2 % cream Reported on 10/21/2015 02/19/2015: Received from: External Pharmacy   No facility-administered encounter medications on file as of 09/01/2016.     Functional Status: In your present state of health, do you have any difficulty performing  the following activities: 06/08/2016  Hearing? N  Vision? N  Difficulty concentrating or making decisions? Y  Walking or climbing stairs? N  Dressing or bathing? N  Doing errands, shopping? N  Preparing Food and eating ? N  Using the Toilet? N  In the past six months, have you accidently leaked urine? N  Do you have problems with loss of bowel control? N  Managing your Medications? Y  Managing your Finances? N  Housekeeping or managing your Housekeeping? N  Some recent data might be hidden    Fall/Depression Screening: PHQ 2/9 Scores 09/01/2016 07/15/2016 06/08/2016 04/18/2015 02/25/2014 08/11/2011  PHQ - 2 Score 3 0 0 0 0 0  PHQ- 9 Score 8 - - - - -    Assessment: Medication Adherence: Patient reports medication adherence and is adherent to his medications based on refill dates. Patient does not want to use a pill box at this time.  He has just moved into an independent living facility and is becoming accustomed to living here with his wife.   Plan: Counseled on medication adherence and discussed [facility] care management services.  Patient does not believe he needs further [facility] services at this time [place] will close case as patient states adherence to his regimen at this time.   Manuel [name], PharmD, [place] PGY2 Pharmacy Resident [phone]

## 2016-10-05 ENCOUNTER — Ambulatory Visit: Payer: Medicare Other | Admitting: Family Medicine

## 2016-10-25 ENCOUNTER — Ambulatory Visit: Payer: Medicare Other | Admitting: Neurology

## 2016-10-26 ENCOUNTER — Telehealth: Payer: Self-pay | Admitting: Neurology

## 2016-10-26 ENCOUNTER — Telehealth: Payer: Self-pay | Admitting: Family Medicine

## 2016-10-26 NOTE — Telephone Encounter (Signed)
Returned pt's son, [name], call -  [name] is advising the following status on his father:  1- He has moved his dad and mom into [facility].   2- Pt's memory and confusion has gotten a lot worse.   ** Pt will not listen to him or his sister; Pt's pastor, friends and sister states he repeats things a lot and forget a lot of things he has said.  ** Pt asked [name] to sell one of the cars but accuses him of stealing the car.    **Pt is now stating that he is miserable; staying angry more; and is becoming more despondent.  3- Pt will not take Aricept 10 mg at all or is very inconsistent. [name] states as he is preparing to sell their house, he has found numerous bottles of Aricept that are either completely full or looks like some tablets may have been taken.  4- Pt has begun putting words together but they do not make sense at all.  [name] states he does not know what his next step should be and any advice given at all will be most helpful.  I advsd I would forward message to Dr. Delice Lesch and call him back once she responds.

## 2016-10-26 NOTE — Telephone Encounter (Signed)
Son, [name], requesting to speak to Dr Redmond School because he is concerned about the changes he is seeing in the pt. He is see increased mental impairment, paranoid, aggression, anger. Call Mark at [phone]

## 2016-10-26 NOTE — Telephone Encounter (Signed)
Call and set him up to have an appointment. Tell him that I want to see him again and then call his son and later know about the appointment

## 2016-10-26 NOTE — Telephone Encounter (Signed)
PT's son would like a call back regarding behavior changes and memory/Dawn

## 2016-10-26 NOTE — Telephone Encounter (Signed)
Left message to call me back.

## 2016-10-27 NOTE — Telephone Encounter (Signed)
Son informed.

## 2016-10-27 NOTE — Telephone Encounter (Signed)
Mr.Hamme has appointment 11/01/16 called son [name] left message for him to call me back

## 2016-10-28 NOTE — Telephone Encounter (Signed)
I see he has a visit with Dr. Redmond School on Monday. I would suggest we do Neurocognitive testing to further evaluate the changes going on, it is done in our office to evaluate the memory and personality changes with more testing. Pls schedule if son okay with proceeding.

## 2016-10-28 NOTE — Telephone Encounter (Signed)
Clld pt's son, Manuel [name] - advsd of Dr. Amparo Bristol recommendations. Manuel [name] stated he will be out of town on Monday, will see if his sister can go doctor's appt with their father. He stated he will talk with her next week regarding the neurocognitive testing then call to schedule appt.

## 2016-11-01 ENCOUNTER — Encounter: Payer: Self-pay | Admitting: Family Medicine

## 2016-11-01 ENCOUNTER — Ambulatory Visit (INDEPENDENT_AMBULATORY_CARE_PROVIDER_SITE_OTHER): Payer: Medicare Other | Admitting: Family Medicine

## 2016-11-01 VITALS — BP 120/80 | HR 80 | Wt 199.0 lb

## 2016-11-01 DIAGNOSIS — F0390 Unspecified dementia without behavioral disturbance: Secondary | ICD-10-CM

## 2016-11-01 DIAGNOSIS — F329 Major depressive disorder, single episode, unspecified: Secondary | ICD-10-CM

## 2016-11-01 DIAGNOSIS — F03A0 Unspecified dementia, mild, without behavioral disturbance, psychotic disturbance, mood disturbance, and anxiety: Secondary | ICD-10-CM

## 2016-11-01 MED ORDER — ESCITALOPRAM OXALATE 10 MG PO TABS: 10.0000 mg | ORAL_TABLET | Freq: Every day | ORAL | 2 refills | Status: DC

## 2016-11-02 NOTE — Progress Notes (Signed)
   Subjective:    Patient ID: Manuel [name], male    DOB: Dec 06, 1940, 76 y.o.   MRN: [id number]  HPI he is here for an interval evaluation. His son called explaining that his father has had difficulty with anger outbursts as well as memory issues. Son and daughter have taken over more responsibilities for the parents and this has created some changes in the dynamics. Married and his wife are now in assisted living and adjusting to that. They're having some complaints about this. Manuel [name] does recognize that he has had memory issues as well as being irritated. He is uncomfortable with a role reversal that is taking place. He does continue to drive.  Review of Systems     Objective:   Physical Exam Alert and in no distress. Appropriately dressed a good demeanor       Assessment & Plan:  Mild dementia  Reactive depression - Plan: escitalopram (LEXAPRO) 10 MG tablet After discussion with him, he did consent to be placed on medication to help with better control of his mood. Also suggested using transportation provided by [facility] is much as possible. We'll monitor this closely. No evidence of suicidal ideation.

## 2016-11-15 ENCOUNTER — Encounter: Payer: Self-pay | Admitting: Medical

## 2016-11-15 ENCOUNTER — Ambulatory Visit (INDEPENDENT_AMBULATORY_CARE_PROVIDER_SITE_OTHER): Payer: Medicare Other | Admitting: Medical

## 2016-11-15 VITALS — BP 136/80 | HR 68 | Wt 201.4 lb

## 2016-11-15 DIAGNOSIS — H9192 Unspecified hearing loss, left ear: Secondary | ICD-10-CM

## 2016-11-15 DIAGNOSIS — H6122 Impacted cerumen, left ear: Secondary | ICD-10-CM

## 2016-11-15 NOTE — Progress Notes (Signed)
  Subjective:   Here for complaint of earwax buildup in left ear.  Symptoms include hearing decreased in left ear.   male reports prior history of similar.  No other aggravating or relieving factors. He does note that sometimes wife says he doesn't listen, but doesn't seem to be a big issue. No other complaint.  Review of Systems Constitutional: denies fever, chills, sweats ENT: no runny nose, ear pain, sore throat, hoarseness, sinus pain, teeth pain, tinnitus, hearing loss Gastroenterology: denies nausea, vomiting     Objective:   Physical Exam  General appearance: alert, no distress, WD/WN Ears: left ear canal with impacted cerumen, right ear canal normal.  TM on right pearly, fine. HENT: conjunctiva/corneas normal, sclerae anicteric, nares patent, no discharge or erythema, pharynx normal Oral cavity: MMM, tongue normal, teeth normal Neurological: hearing decreased with whisper on left    Assessment & Plan:    Encounter Diagnoses  Name Primary?  . Hearing decreased, left Yes  . Impacted cerumen of left ear      Discussed findings.  Discussed risk/benefits of procedure and patient agrees to procedure. Successfully used warm water lavage to remove impacted cerumen from left ear canal. Patient tolerated procedure well. Advised they avoid using any cotton swabs or other devices to clean the ear canals.  Use basic hygiene as discussed.    Discussed his abnormal hearing screen.   Offered referral to ENT.  He will consider and let us know.  Follow up prn.

## 2016-11-19 ENCOUNTER — Other Ambulatory Visit: Payer: Self-pay | Admitting: Internal Medicine

## 2016-12-07 ENCOUNTER — Ambulatory Visit: Payer: Medicare Other | Admitting: Family Medicine

## 2016-12-14 ENCOUNTER — Encounter: Payer: Self-pay | Admitting: Family Medicine

## 2016-12-14 ENCOUNTER — Ambulatory Visit (INDEPENDENT_AMBULATORY_CARE_PROVIDER_SITE_OTHER): Payer: Medicare Other | Admitting: Family Medicine

## 2016-12-14 VITALS — BP 110/70 | HR 73 | Wt 198.0 lb

## 2016-12-14 DIAGNOSIS — F03A0 Unspecified dementia, mild, without behavioral disturbance, psychotic disturbance, mood disturbance, and anxiety: Secondary | ICD-10-CM

## 2016-12-14 DIAGNOSIS — F0390 Unspecified dementia without behavioral disturbance: Secondary | ICD-10-CM | POA: Diagnosis not present

## 2016-12-14 NOTE — Progress Notes (Signed)
   Subjective:    Patient ID: Manuel [name], male    DOB: 01-19-1941, 76 y.o.   MRN: [id number]  HPI He is here for follow-up visit. He did not pick up his Lexapro and has therefore been off for several days. He offers no complaints. He is getting adjusted to living at [facility]. Apparently his house is getting ready to be sold. His son is helping with this.   Review of Systems     Objective:   Physical Exam Alert and in no distress with appropriate affect       Assessment & Plan:  Mild dementia Did encourage him to get back on the Lexapro. At this point he seems to be fairly stable but will keep an open mind on this. I will hear from his son on an as-needed basis. Follow-up here in 3 months or sooner if need be.

## 2016-12-14 NOTE — Patient Instructions (Signed)
Get back on the Lexapro

## 2016-12-15 ENCOUNTER — Ambulatory Visit (INDEPENDENT_AMBULATORY_CARE_PROVIDER_SITE_OTHER): Payer: Medicare Other | Admitting: Neurology

## 2016-12-15 ENCOUNTER — Telehealth: Payer: Self-pay | Admitting: Neurology

## 2016-12-15 ENCOUNTER — Encounter: Payer: Self-pay | Admitting: Neurology

## 2016-12-15 VITALS — BP 148/86 | HR 76 | Ht 71.0 in | Wt 197.0 lb

## 2016-12-15 DIAGNOSIS — F0390 Unspecified dementia without behavioral disturbance: Secondary | ICD-10-CM

## 2016-12-15 DIAGNOSIS — F03A0 Unspecified dementia, mild, without behavioral disturbance, psychotic disturbance, mood disturbance, and anxiety: Secondary | ICD-10-CM

## 2016-12-15 NOTE — Telephone Encounter (Signed)
[name], do you think this is something you can help [place] with? Thanks

## 2016-12-15 NOTE — Patient Instructions (Addendum)
1. Continue all your medications 2. Schedule Neurocognitive testing with Dr. Si Raider to further evaluate memory 3. Follow-up in 3 months, call for any changes  You have been referred for a neurocognitive evaluation in our office.   The evaluation takes approximately two hours. The first part of the appointment is a clinical interview with the neuropsychologist (Dr. Macarthur Critchley). Please bring someone with you to this appointment if possible, as it is helpful for Dr. Si Raider to hear from both you and another adult who knows you well. After speaking with Dr. Si Raider, you will complete testing with her technician. The testing includes a variety of tasks- mostly question-and-answer, some paper-and-pencil. There is nothing you need to do to prepare for this appointment, but having a good night's sleep prior to the testing, and bringing eyeglasses and hearing aids (if you wear them), is advised.   About a week after the evaluation, you will return to follow up with Dr. Si Raider to review the test results. This appointment is about 30 minutes. If you would like a family member to receive this information as well, please bring them to the appointment.   We have to reserve several hours of the neuropsychologist's time and the psychometrician's time for your evaluation appointment. As such, please note that there is a No-Show fee of $100. If you are unable to attend any of your appointments, please contact our office as soon as possible to reschedule.

## 2016-12-15 NOTE — Progress Notes (Signed)
NEUROLOGY FOLLOW UP OFFICE NOTE  Manuel [name] [id number]  HISTORY OF PRESENT ILLNESS: I had the pleasure of seeing Manuel [name] in follow-up in the neurology clinic on 12/15/2016.  The patient was last seen 8 months ago for worsening memory loss. He is alone in the office today. MMSE in September 2017 was 23/30. His son had called our office last March to report that his father's memory and confusion had gotten a lot worse. He would not listen to his children. His pastor, friends, and sister states he repeats himself and forgets a lot of things he has said. He puts words together that do not make sense at all. He asked his son to sell one of the cars but accuses him of stealing it. He reported his father is miserable, staying angry more, and becoming more despondent. He has moved into independent living at [facility] in [place]. He is very inconsistent with taking Aricept, when his son was preparing to sell their house, he found numerous bottles of Aricept either completely full or like some may have been taken.   Manuel [name] comes alone today and states he drove to the office. He sometimes gets lost driving. It is unclear about medications, there may be a visiting nurse that comes to their home, but he states he sometimes does not want to take a medication that is named differently, then later on shows me his list of medications and states he takes them. His son is his financial POA and it appears he is in charge of finances now. He does report depression, he reports he is more depressed due to his wife's health and being in a new place where they don't seem to fit in. He denies any headaches, dizziness, diplopia, dysarthria, dysphagia, neck/back pain, focal numbness/tingling, bowel/bladder dysfunction.   HPI 09/05/15: This is a pleasant 76 yo RH man with a history of hypertension, hyperlipidemia, who presented for evaluation of worsening memory. He feels his memory is pretty good, except for  short-term memory, he occasionally forgets names, sometimes forgets his medications. He misplaces things more frequently. He has occasional word-finding difficulties. His son started noticing memory changes more than 2 years ago, but have noticed it worsening over the past year. His wife reports that he asks the same questions repeatedly. He would get something on his mind and then become fixated on it, around 2 years ago he got fixated on death and bought a burial plot, then kept repeating this to his children. His son relates that when the patient's got wife got sick, he and his family had countless conversations about her illness, then one day he asked if they had ever heard of her illness. Conversations are very repetitive, almost in a loop. His son feels that there may be a component of anxiety, when the patient drives down to [place] to visit his son, this is a big obstacle for him to drive on the highway. His son also feels that the patient's stress level with his being caregiver for his wife and having to take his own medications is a big responsibility for him. He denies getting lost driving, but 2 weeks ago could not find his car in the parking lot at [facility]. His wife has always been in charge of [employer]. No difficulties with ADLs. They deny any significant personality changes, no paranoia. His mother had memory problems. He denies any significant head injuries, very infrequent alcohol intake. He had an MMSE at his PCP office in  January 2017 which was 23/30.  He has had right-hand weakness for the past 30 years where he cannot flex his fingers. He tries to write mostly with his left hand.   PAST MEDICAL HISTORY: Past Medical History:  Diagnosis Date  . Adrenal adenoma   . Arthritis   . C. difficile colitis   . Family history of anesthesia complication    MOTHER   . GERD (gastroesophageal reflux disease)   . Hypercholesteremia   . Hypertension   . Internal hemorrhoids   .  Neuromuscular disorder ([place])    ETIOLOGY UNKNOWN     MEDICATIONS:  Outpatient Encounter Prescriptions as of 12/15/2016  Medication Sig  . ALPRAZolam (XANAX) 0.25 MG tablet TAKE 1 TABLET BY MOUTH TWICE A DAY AS NEEDED FOR ANXIETY  . atorvastatin (LIPITOR) 10 MG tablet TAKE 1 TABLET (10 MG TOTAL) BY MOUTH DAILY.  Marland Kitchen donepezil (ARICEPT) 10 MG tablet Take 1 tablet daily  . escitalopram (LEXAPRO) 10 MG tablet Take 1 tablet (10 mg total) by mouth daily. (Patient not taking: Reported on 12/14/2016)  . famotidine (PEPCID) 20 MG tablet TAKE 1 TABLET BY MOUTH TWICE A DAY  . memantine (NAMENDA) 5 MG tablet Take 1 tablet (5 mg total) by mouth 2 (two) times daily. (Patient not taking: Reported on 11/15/2016)  . NON FORMULARY Take 1 capsule by mouth 2 (two) times daily. FDGard  . valsartan-hydrochlorothiazide (DIOVAN-HCT) 160-12.5 MG tablet TAKE 1 TABLET BY MOUTH DAILY.   No facility-administered encounter medications on file as of 12/15/2016.      ALLERGIES: Allergies  Allergen Reactions  . Ace Inhibitors Cough    Lisinopril     FAMILY HISTORY: Family History  Problem Relation Age of Onset  . Heart attack Father     SOCIAL HISTORY: Social History   Social History  . Marital status: Married    Spouse name: N/A  . Number of children: 2  . Years of education: N/A   Occupational History  . Retired    Social History Main Topics  . Smoking status: Former Smoker    Packs/day: 1.00    Years: 20.00    Types: Cigarettes    Quit date: 05/11/1982  . Smokeless tobacco: Never Used  . Alcohol use 0.6 oz/week    1 Glasses of wine per week     Comment: occasionally  . Drug use: No  . Sexual activity: Yes   Other Topics Concern  . 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    REVIEW OF SYSTEMS: Constitutional: No fevers, chills, or sweats, no generalized fatigue, change in appetite Eyes: No visual changes, double vision, eye pain Ear, nose and throat: No  hearing loss, ear pain, nasal congestion, sore throat Cardiovascular: No chest pain, palpitations Respiratory:  No shortness of breath at rest or with exertion, wheezes GastrointestinaI: No nausea, vomiting, diarrhea, abdominal pain, fecal incontinence Genitourinary:  No dysuria, urinary retention or frequency Musculoskeletal:  No neck pain, back pain Integumentary: No rash, pruritus, skin lesions Neurological: as above Psychiatric: No depression, insomnia, anxiety Endocrine: No palpitations, fatigue, diaphoresis, mood swings, change in appetite, change in weight, increased thirst Hematologic/Lymphatic:  No anemia, purpura, petechiae. Allergic/Immunologic: no itchy/runny eyes, nasal congestion, recent allergic reactions, rashes  PHYSICAL EXAM: Vitals:   12/15/16 1059  BP: (!) 148/86  Pulse: 76   General: No acute distress Head:  Normocephalic/atraumatic Neck: supple, no paraspinal tenderness, full range of motion Heart:  Regular rate and rhythm Lungs:  Clear to auscultation bilaterally Back: No paraspinal tenderness Skin/Extremities: No rash, no edema Neurological Exam: alert and oriented to person, place, and day of week and year. States month is January and it is winter. No aphasia or dysarthria. Fund of knowledge is appropriate.  Remote memory intact.  Attention and concentration are normal.    Able to name objects and repeat phrases. CDT 5/5 MMSE - Mini Mental State Exam 12/15/2016 04/07/2016  Orientation to time 5 2  Orientation to Place 5 5  Registration 3 3  Attention/ Calculation 5 5  Recall 0 0  Language- name 2 objects 2 2  Language- repeat 1 1  Language- follow 3 step command 2 2  Language- read & follow direction 1 1  Write a sentence 0 1  Copy design 1 1  Total score 25 23   Cranial nerves: Pupils equal, round, reactive to light. No facial asymmetry. Motor: moves all extremities symmetrically.Gait narrow-based and steady.   IMPRESSION: This is a pleasant 76 yo RH  man with vascular risk factors including hypertension and hyperlipidemia, with worsening memory loss. His neurological exam is overall non-focal, MMSE today 25/30 (23/30 in September 2017, MOCA score 24/30 in February 2017). His son expressed concerns about worsening memory and mood, by history suggestive of mild dementia. History is unclear, but it appears there continues to be some difficulties with taking his medications as instructed. He would benefit from closer monitoring at his facility. He will be scheduled for Neurocognitive testing to further evaluate symptoms to help guide patient and family through the diagnosis and future plans. Continue Aricept and Namenda, he denies any side effects (if he is taking them). He was advised to have family with him with Neurocognitive testing. Continue to monitor driving. Continue to monitor mood, he is taking Lexapro 10mg  daily. We again discussed the importance of control of vascular risk factors, physical exercise, and brain stimulation exercises for brain health.  He will follow-up after Neurocognitive testing, hopefully with family present.  Thank you for allowing me to participate in his care.  Please do not hesitate to call for any questions or concerns.  The duration of this appointment visit was 25 minutes of face-to-face time with the patient.  Greater than 50% of this time was spent in counseling, explanation of diagnosis, planning of further management, and coordination of care.   Ellouise Newer, M.D.   CC: Dr. Redmond School

## 2016-12-15 NOTE — Telephone Encounter (Signed)
Returned pt son's call.  He says that he was unaware of today's appointment.  Says his father called him and was confused about why he was seen in our office today and that he didn't get any information from the pt other than he was seen today.  Let son know that Dr. Delice Lesch is seeing some memory changes and that we have scheduled appointments with Dr. Si Raider for NeuroPsych testing.  Gave son the pt's upcoming appointment dates and times.  He states that pt is not taking his Aricept correctly, if at all (pt stated he was taking it correctly while in the office today).  Son expressed frustration as both of his parents are dealing with issues (pt's wife has parkinson's with related memory issues) He asked for suggestions for home care as well as live in facilities, because pt and wife are currently living in an assisted facility - still independent - but too expensive for more intensive care. He would like suggestions on how to also make sure pt takes his medications correctly.  Pt wife has an automatic dispense lock box for her medications - he thinks if pt had this it would cause even more confusion as pt and wife may end up taking wrong medications. He  also asked that he be notified of future appointments and appointment confirmations.  Let son know I would send message to Dr. Delice Lesch about home care and medication suggestions.

## 2016-12-15 NOTE — Telephone Encounter (Signed)
PT's son [name] called and left a message for Dr Delice Lesch to call him back, he has some questions about today's appointment/Dawn

## 2016-12-16 NOTE — Telephone Encounter (Signed)
Pls let son know that his father told me he sometimes gets lost, so I would recommend no driving, however it does not appear he will do this. When he has the neurocognitive testing, there is a part there that correlates with driving abilities, this can help [place] with talking to him further about it as well. Thanks

## 2016-12-16 NOTE — Telephone Encounter (Signed)
Contacted pt son, Radin Raptis to provide support and offer resources. Provided supportive listening as pt son discussed concerns surrounding pt father memory. Pt son reports that pt and pt wife are in an independent living facility and do not have assistance coming into the independent living at this time. Pt son hopeful for recommendations about what pt needs assistance with and what pt son should be enforcing as far as additional help, moving to more supervised environment, driving? CSW discussed with pt son about how the upcoming neurocognitive testing will help provide recommendations for areas that pt needs additional assistance. Pt son is aware of neurocognitive testing appointments and plans to attend with pt. Pt son ask if pt should be driving now? Dr. Delice Lesch, please advise.  CSW discussed with pt son resources including Assisted Living and private duty in home care agencies. CSW discussed that an Assisted Living will provide more supervision surrounding medications, but still allow independence. Pt son discussed concern surrounding cost due to the cost of the independent living pt and pt wife are currently in. CSW discussed that cost usually depends on amount of care needed. CSW discussed with pt son that it may be beneficial to discuss with the independent living that pt and pt wife are currently in about additional support in their current home and/or pricing of moving to the assisted living. Pt son also agreeable to receive list of assisted living facilities as cost varies from facility to facility. Pt son is concerned about another move because pt son feels pt and pt wife had been more depressed since moving to the independent living facility. CSW discussed option of private duty in home care that can assist with task from medication management to personal care to household assistance. Pt agreeable to receiving list of private duty care agencies. CSW discussed with pt son that [facility] independent  living may have a contract with a particular agency.   CSW e-mailed pt son resources discussed. CSW validated pt feelings and concerns and provided positive reinforcement for pt son support for pt. Pt son provided CSW contact information and agrees to contact CSW with any questions about resources or if further social work needs arise. CSW to remain available to provide psychosocial support as needed.  Alison Murray, MSW, LCSW Clinical Social Worker [facility] [phone]

## 2016-12-17 NOTE — Telephone Encounter (Signed)
Called pt son and relayed message below.  He agrees with Dr. Delice Lesch in that pt will probably not follow the advise given.  Son states unless he physically takes the keys to pt car, pt will most likely continue to drive.  Pt son says he will enforce it after neurocognitive testing results, with the hopes that pt will be better understanding then.

## 2017-01-05 ENCOUNTER — Other Ambulatory Visit: Payer: Self-pay | Admitting: *Deleted

## 2017-01-05 ENCOUNTER — Telehealth: Payer: Self-pay | Admitting: Family Medicine

## 2017-01-05 ENCOUNTER — Telehealth: Payer: Self-pay | Admitting: Neurology

## 2017-01-05 DIAGNOSIS — R410 Disorientation, unspecified: Secondary | ICD-10-CM

## 2017-01-05 DIAGNOSIS — R4189 Other symptoms and signs involving cognitive functions and awareness: Secondary | ICD-10-CM

## 2017-01-05 MED ORDER — DIVALPROEX SODIUM ER 250 MG PO TB24: ORAL_TABLET | ORAL | 6 refills | Status: DC

## 2017-01-05 NOTE — Telephone Encounter (Signed)
Pls let [name] know what [facility] said, if his sister could come and bring him to lab. Thanks

## 2017-01-05 NOTE — Telephone Encounter (Signed)
LMOM for son, [name], to contact our office to try and get pt in for labwork.  Will place orders once I hear back from [facility].

## 2017-01-05 NOTE — Telephone Encounter (Signed)
Let's work on getting him in as I mentioned earlier with neurology and call the son and let him know that. Let him know that I'm off today.

## 2017-01-05 NOTE — Telephone Encounter (Signed)
VM-PT's son Manuel [name] left a message and said he has an urgent matter to discuss with Dr Delice Lesch, he received several calls from the assisted living facility saying PT was acting erratically, and wanted a call back to discuss what can be done
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
## 2017-01-05 NOTE — Telephone Encounter (Signed)
Spoke to son Manuel [name]. Patient is back at home, seems rattled and confused; up most of the night, unable to settle, dressed up at 5am started driving around the parking lot; parked and forgot where he was, went to the office at [facility]. Last night it was very hard to convince him that there was no basketball game that he wanted to go to.   Discussed plan of care with Manuel [name], this appears to be worsening of underlying dementia, possibly due to sleep deprivation, infection should be ruled out. We will order CBC, CMP, urinalysis through [facility]. If labs are normal, plan to start Depakote ER 250mg  qhs to help with behavioral changes assoc with dementia. Manuel [name] is not sure he would take medications, we will send a referral to [facility] for visiting nurse.   Meagan pls call [facility] [phone]) and ask how we would go about for them to do CBC, CMP, urinalysis. Also, pls send referral for Home Health for visiting nurse. Pls update son Manuel [name] after we have done all this. Thanks

## 2017-01-05 NOTE — Telephone Encounter (Signed)
Referral placed for home health nurse.  Called [facility], they state that since he is not an assisted living patient, but an independent living resident that they cannot draw blood for labwork. Advised me that they usually send out for lab work.  Dr. Delice Lesch, should I put in an order for [facility] lab and see if son can bring him?

## 2017-01-06 ENCOUNTER — Telehealth: Payer: Self-pay | Admitting: Family Medicine

## 2017-01-06 ENCOUNTER — Telehealth: Payer: Self-pay | Admitting: Neurology

## 2017-01-06 NOTE — Telephone Encounter (Signed)
done

## 2017-01-06 NOTE — Telephone Encounter (Signed)
PT's son [name] left a voicemail message saying he spoke to Dr Delice Lesch and that something was supposed to be faxed to the assisted living place and he has not heard from anyone and would like a call back

## 2017-01-06 NOTE — Telephone Encounter (Signed)
Pt's wife, Archie Patten, called. She stated that Mr. Manuel [name] has gotten significantly worse just this week. When asked how,  she stated that his memory is remarkable worse. She states she did not know what to do. Pt is requesting assistance. Please call pt at [phone].

## 2017-01-07 ENCOUNTER — Other Ambulatory Visit: Payer: Self-pay

## 2017-01-07 ENCOUNTER — Other Ambulatory Visit: Payer: Medicare Other

## 2017-01-07 ENCOUNTER — Telehealth: Payer: Self-pay | Admitting: Neurology

## 2017-01-07 DIAGNOSIS — F03A0 Unspecified dementia, mild, without behavioral disturbance, psychotic disturbance, mood disturbance, and anxiety: Secondary | ICD-10-CM

## 2017-01-07 DIAGNOSIS — F0390 Unspecified dementia without behavioral disturbance: Secondary | ICD-10-CM

## 2017-01-07 LAB — URINALYSIS
Bilirubin Urine: NEGATIVE
Glucose, UA: NEGATIVE
HGB URINE DIPSTICK: NEGATIVE
Ketones, ur: NEGATIVE
NITRITE: NEGATIVE
PROTEIN: NEGATIVE
Specific Gravity, Urine: 1.01 (ref 1.001–1.035)
pH: 6.5 (ref 5.0–8.0)

## 2017-01-07 LAB — COMPLETE METABOLIC PANEL WITH GFR
ALBUMIN: 4.2 g/dL (ref 3.6–5.1)
ALK PHOS: 70 U/L (ref 40–115)
ALT: 14 U/L (ref 9–46)
AST: 13 U/L (ref 10–35)
BILIRUBIN TOTAL: 0.6 mg/dL (ref 0.2–1.2)
BUN: 13 mg/dL (ref 7–25)
CALCIUM: 9 mg/dL (ref 8.6–10.3)
CO2: 27 mmol/L (ref 20–31)
Chloride: 103 mmol/L (ref 98–110)
Creat: 0.82 mg/dL (ref 0.70–1.18)
GFR, EST NON AFRICAN AMERICAN: 87 mL/min (ref >=60)
GLUCOSE: 98 mg/dL (ref 65–99)
POTASSIUM: 3.9 mmol/L (ref 3.5–5.3)
SODIUM: 138 mmol/L (ref 135–146)
TOTAL PROTEIN: 7.1 g/dL (ref 6.1–8.1)

## 2017-01-07 LAB — CBC WITH DIFFERENTIAL/PLATELET
BASOS ABS: 65 {cells}/uL (ref 0–200)
Basophils Relative: 1 %
EOS ABS: 195 {cells}/uL (ref 15–500)
EOS PCT: 3 %
HCT: 42.1 % (ref 38.5–50.0)
HEMOGLOBIN: 14.4 g/dL (ref 13.2–17.1)
LYMPHS ABS: 1625 {cells}/uL (ref 850–3900)
Lymphocytes Relative: 25 %
MCH: 31.7 pg (ref 27.0–33.0)
MCHC: 34.2 g/dL (ref 32.0–36.0)
MCV: 92.7 fL (ref 80.0–100.0)
MPV: 9.4 fL (ref 7.5–12.5)
Monocytes Absolute: 845 cells/uL (ref 200–950)
Monocytes Relative: 13 %
Neutro Abs: 3770 cells/uL (ref 1500–7800)
Neutrophils Relative %: 58 %
Platelets: 236 10*3/uL (ref 140–400)
RBC: 4.54 MIL/uL (ref 4.20–5.80)
RDW: 13.6 % (ref 11.0–15.0)
WBC: 6.5 10*3/uL (ref 4.0–10.5)

## 2017-01-07 NOTE — Telephone Encounter (Signed)
-----   Message from Cameron Sprang, MD sent at 01/07/2017  1:46 PM EDT ----- Pls let son know the bloodwork is normal, liver and kidney are good, no evidence of urinary infection. This is most likely worsening of dementia, would start the Depakote I called in to his pharmacy. Thanks

## 2017-01-07 NOTE — Telephone Encounter (Signed)
PT's son left a VM message and is very upset because he said no one has returned his call regarding PT and he wants a call back within the next hour or he is going higher

## 2017-01-07 NOTE — Telephone Encounter (Signed)
It sounds like he did not get the phone message that [facility] does not do labs. Pls let him know you called and what they should do instead. I was able to speak to him the last time at [phone]. Thanks

## 2017-01-07 NOTE — Telephone Encounter (Signed)
Spoke with pt son, Manuel [name].  Orders were placed on Wednesday for a home health nurse to visit and evaluate [name].  Orders were placed this morning for CBC, CMP, UA - pt at Dr Lanice Shirts office now for blood draw.        Pt son was upset at first about not hearing back from our office, once realized that we had his home number, he calmed down a little.  Updated son's phone number in pt's chart to cell phone number.  (Pt son on vacation)  Above orders were entered as STAT and I will contact son to update when the results are faxed to me.

## 2017-01-07 NOTE — Telephone Encounter (Signed)
LMOM with pt's son, [name], relaying message below.  I also gave him my direct number to leave messages for me.

## 2017-01-10 ENCOUNTER — Telehealth: Payer: Self-pay

## 2017-01-10 NOTE — Telephone Encounter (Signed)
Pt son left message for me.  States that [facility] Nurse has not been able to make contact with pt.  States he thinks she may have called and left a massage.  Wants to know when he can expect someone to go out there.  Also asked about resent Rx, which I have sent out.  Will call and remind him that it should be waiting for pickup.

## 2017-01-11 ENCOUNTER — Other Ambulatory Visit: Payer: Self-pay

## 2017-01-11 DIAGNOSIS — R4189 Other symptoms and signs involving cognitive functions and awareness: Secondary | ICD-10-CM

## 2017-01-19 ENCOUNTER — Telehealth: Payer: Self-pay | Admitting: Neurology

## 2017-01-19 ENCOUNTER — Other Ambulatory Visit: Payer: Self-pay

## 2017-01-19 DIAGNOSIS — R4182 Altered mental status, unspecified: Secondary | ICD-10-CM

## 2017-01-19 NOTE — Telephone Encounter (Signed)
Spoke to son [name], he picked up the Depakote, 4hrs later had no memory that he picked up the medication. Tricky to get him to take it, but family is trying to make sure he does. Home Health is finally coming today.  His father called 17 times on Friday not remembering that they had talked. He is acknowledging that he is fuzzy, "I feel woolly." He was up another long night moving around the house, opening and closing drawers in the kitchen. The most concerning to him is he cannot understand him, not slurred, he has a word salad, a bunch of words that do not seem to make any sense. Yesterday, he heard one of his aunt's died, it took 75mins to get that out and clear from him.   Meagen, can you pls order a head CT without contrast for him to be done asap? For altered mental status. Thanks

## 2017-01-19 NOTE — Telephone Encounter (Signed)
STAT CT ordered and sent to [facility].

## 2017-01-25 ENCOUNTER — Telehealth: Payer: Self-pay

## 2017-01-25 NOTE — Telephone Encounter (Signed)
Med compliance, dementia, cardiopulmonary monitoring.

## 2017-01-25 NOTE — Telephone Encounter (Signed)
Manuel [name] with [facility] called requesting the following services on pt: SN 1x/wk for 5 wk and 2 prn visits.  CB # [id number].

## 2017-01-25 NOTE — Telephone Encounter (Signed)
What is the diagnosis/reason?

## 2017-01-25 NOTE — Telephone Encounter (Signed)
ok 

## 2017-01-25 NOTE — Telephone Encounter (Signed)
Nicolette made aware VO given. /RLB  

## 2017-01-26 ENCOUNTER — Telehealth: Payer: Self-pay

## 2017-01-26 NOTE — Telephone Encounter (Signed)
Sounds like he needs to be in memory care now. Proceed with head CT, if any significant changes before then, go to ER. Thanks
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
## 2017-01-27 ENCOUNTER — Other Ambulatory Visit: Payer: Self-pay | Admitting: Family Medicine

## 2017-01-28 ENCOUNTER — Telehealth: Payer: Self-pay

## 2017-01-28 ENCOUNTER — Ambulatory Visit
Admission: RE | Admit: 2017-01-28 | Discharge: 2017-01-28 | Disposition: A | Discharge status: Home / Self Care | Payer: Medicare Other | Source: Ambulatory Visit | Attending: Neurology | Admitting: Neurology

## 2017-01-28 DIAGNOSIS — R4182 Altered mental status, unspecified: Secondary | ICD-10-CM

## 2017-01-28 NOTE — Telephone Encounter (Signed)
-----   Message from Cameron Sprang, MD sent at 01/28/2017  3:06 PM EDT ----- Please let son know there are no significant abnormalities on the head CT, no evidence of bleed, tumor, or stroke. Thanks

## 2017-01-28 NOTE — Telephone Encounter (Signed)
Spoke with pt son, [name], relaying message below.  He was appreciative yet frustrated because he still has no answers.  I also relayed that Dr. Delice Lesch agrees with [facility]'s that pt may require memory care now.  [name] stated "I agree too, it just isn't what I wanted to hear."  [name] is hopeful that pt's appointments with Dr. Marcos Eke will give some insight to the rate of decline.  Mark plans on being present during all of pt's appointments with Dr. Marcos Eke

## 2017-02-01 ENCOUNTER — Telehealth: Payer: Self-pay | Admitting: Family Medicine

## 2017-02-01 NOTE — Telephone Encounter (Signed)
I think this goes to you  

## 2017-02-01 NOTE — Telephone Encounter (Signed)
Pt came in the office today stating that he stopped by to see Dr Redmond School and get checked out or tested since Dr Redmond School did not like the way something looked with his body somewhere. Pt did not know specifics. Pt was told he could leave for now & we would call him back.

## 2017-02-01 NOTE — Telephone Encounter (Signed)
There is no reason for him to be here. Let's not do anything and see if he calls back

## 2017-02-02 ENCOUNTER — Other Ambulatory Visit: Payer: Self-pay | Admitting: Internal Medicine

## 2017-02-03 ENCOUNTER — Telehealth: Payer: Self-pay | Admitting: Family Medicine

## 2017-02-03 NOTE — Telephone Encounter (Signed)
ok 

## 2017-02-03 NOTE — Telephone Encounter (Signed)
Harlan Stains with [facility] called. She is requesting orders for pt to receive speech therapy. You may reach her at [phone].

## 2017-02-03 NOTE — Telephone Encounter (Signed)
[place] she was informed it was okay and she verbalized understanding

## 2017-02-09 ENCOUNTER — Telehealth: Payer: Self-pay | Admitting: Family Medicine

## 2017-02-09 NOTE — Telephone Encounter (Signed)
Have him set up an appt to look at his meds

## 2017-02-09 NOTE — Telephone Encounter (Signed)
pts wife called and is very concerned with mr knippenberg states that he is sleeping a lot states he sleeps most of the day and is sleeping through night, states that he was started on a new medicine but she is not for sure what it is,but dont know what gave it to him  She stated that he was on 5 different medicines, she would like you to call mr gubser, he can be reached at [phone]

## 2017-02-09 NOTE — Telephone Encounter (Signed)
Left message for pt to call me back 

## 2017-02-10 ENCOUNTER — Ambulatory Visit (INDEPENDENT_AMBULATORY_CARE_PROVIDER_SITE_OTHER): Payer: Medicare Other | Admitting: Psychology

## 2017-02-10 ENCOUNTER — Encounter: Payer: Self-pay | Admitting: Psychology

## 2017-02-10 ENCOUNTER — Other Ambulatory Visit: Payer: Medicare Other

## 2017-02-10 DIAGNOSIS — F0391 Unspecified dementia with behavioral disturbance: Secondary | ICD-10-CM

## 2017-02-10 NOTE — Progress Notes (Signed)
   Neuropsychology Note  Manuel [name] came in today for 2 hours of neuropsychological testing with technician, Milana Kidney, BS, under the supervision of Dr. Macarthur Critchley. The patient did not appear overtly distressed by the testing session, per behavioral observation or via self-report to the technician. Rest breaks were offered. Manuel [name] will return within 2 weeks for a feedback session with Dr. Si Raider at which time his test performances, clinical impressions and treatment recommendations will be reviewed in detail. The patient understands he can contact our office should he require our assistance before this time.  Full report to follow.
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
## 2017-02-18 ENCOUNTER — Telehealth: Payer: Self-pay | Admitting: Family Medicine

## 2017-02-18 NOTE — Telephone Encounter (Signed)
Manuel [name] with [facility] called  [phone] Needs order for To renew his nursing services once a week for 4 weeks She has concerns He has stopped taking a lot of his meds and he is sleeping a lot And she is not sure if he or his wife either one are capable of managing his meds He will go days and days not taking his meds because he claims they make him sleepy but he will stop taking meds that would not normally make you sleepy  Patient also doesn't know when he needs to be seen again

## 2017-02-18 NOTE — Telephone Encounter (Signed)
Caren Griffins has been informed okay

## 2017-02-18 NOTE — Telephone Encounter (Signed)
Renew the nursing services. Where they being cared for? Sounds like both of them need to be in a memory unit
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
## 2017-02-24 ENCOUNTER — Encounter: Payer: Self-pay | Admitting: Psychology

## 2017-02-24 ENCOUNTER — Ambulatory Visit (INDEPENDENT_AMBULATORY_CARE_PROVIDER_SITE_OTHER): Payer: Medicare Other | Admitting: Psychology

## 2017-02-24 DIAGNOSIS — G301 Alzheimer's disease with late onset: Secondary | ICD-10-CM | POA: Diagnosis not present

## 2017-02-24 DIAGNOSIS — F0281 Dementia in other diseases classified elsewhere with behavioral disturbance: Secondary | ICD-10-CM

## 2017-02-24 DIAGNOSIS — F02818 Dementia in other diseases classified elsewhere, unspecified severity, with other behavioral disturbance: Secondary | ICD-10-CM

## 2017-02-25 ENCOUNTER — Telehealth: Payer: Self-pay | Admitting: Family Medicine

## 2017-02-25 NOTE — Telephone Encounter (Signed)
Manuel [name] @ [facility] called as an FYI to let Dr Redmond School know that she has been attempting to contact the pt to do a speech eval but has not been able to reach the pt so this will be put off until next week. Manuel [name] can be reached at [phone]

## 2017-03-03 ENCOUNTER — Encounter: Payer: Medicare Other | Admitting: Psychology

## 2017-03-10 ENCOUNTER — Telehealth: Payer: Self-pay | Admitting: Family Medicine

## 2017-03-10 NOTE — Telephone Encounter (Signed)
minda with [facility] called and is requesting additional speech therapy for pt. She needs orders for 2 visits for one week.   He will re certified after that and she will contact after that.   She can be reached at [place].

## 2017-03-14 NOTE — Telephone Encounter (Signed)
VO given to [facility]. [name]

## 2017-03-14 NOTE — Telephone Encounter (Signed)
ok 

## 2017-03-16 ENCOUNTER — Telehealth: Payer: Self-pay

## 2017-03-16 NOTE — Telephone Encounter (Signed)
Caren Griffins from [facility] wants new orders for nursing 1 x a week for 6 weeks and for a Education officer, museum due to [place] want take med's tried to contact his son several times he will not call back I gave her okay to continue nursing 1 x a week she said today was her last visit if she didn't get verbal from some one today.

## 2017-03-19 NOTE — Telephone Encounter (Signed)
ok 

## 2017-03-22 ENCOUNTER — Telehealth: Payer: Self-pay

## 2017-03-22 NOTE — Telephone Encounter (Signed)
Alla Feeling [phone], ST she would like verbal orders for ST to continue on this patient. Is this ok? Thanks, Wells Guiles

## 2017-03-22 NOTE — Telephone Encounter (Signed)
ok 

## 2017-03-23 NOTE — Telephone Encounter (Signed)
Called and l/m for minda to ok for therapy

## 2017-03-28 ENCOUNTER — Telehealth: Payer: Self-pay | Admitting: Family Medicine

## 2017-03-28 NOTE — Telephone Encounter (Signed)
Pt's daughter came in and dropped off forms to be completed. Please fax to [facility] attn: Brion Aliment [phone]. They are hoping to move in on Wednesday of this week.

## 2017-03-28 NOTE — Telephone Encounter (Signed)
Sent order for TB test to [facility] [phone]  Fax

## 2017-03-28 NOTE — Telephone Encounter (Signed)
Gay Filler (sister) is requesting order for TB test to [facility]

## 2017-03-31 ENCOUNTER — Ambulatory Visit: Payer: Medicare Other | Admitting: Neurology

## 2017-04-01 ENCOUNTER — Telehealth: Payer: Self-pay

## 2017-04-01 NOTE — Telephone Encounter (Signed)
Manuel [name] signed the physician orders with medications compared to chart so that pt may get medication over the weekend. Physician order forms were faxed back to [facility].  [name]

## 2017-04-28 ENCOUNTER — Telehealth: Payer: Self-pay | Admitting: Family Medicine

## 2017-04-28 MED ORDER — ALPRAZOLAM 0.25 MG PO TABS: ORAL_TABLET | ORAL | 0 refills | Status: DC

## 2017-04-28 NOTE — Telephone Encounter (Signed)
Called in med into pharmacy

## 2017-04-28 NOTE — Telephone Encounter (Signed)
ok 

## 2017-04-28 NOTE — Telephone Encounter (Signed)
[facility] req refill Alprazolam .25 mg

## 2017-05-02 ENCOUNTER — Other Ambulatory Visit: Payer: Self-pay | Admitting: Family Medicine

## 2017-05-02 DIAGNOSIS — G3184 Mild cognitive impairment, so stated: Secondary | ICD-10-CM

## 2017-05-05 ENCOUNTER — Other Ambulatory Visit: Payer: Self-pay | Admitting: Family Medicine

## 2017-05-05 ENCOUNTER — Telehealth: Payer: Self-pay | Admitting: Family Medicine

## 2017-05-05 MED ORDER — ALPRAZOLAM 0.25 MG PO TABS: ORAL_TABLET | ORAL | 0 refills | Status: DC

## 2017-05-05 NOTE — Telephone Encounter (Signed)
Rcvd refill request from [facility] for Alprazolam 0.25 mg #28

## 2017-05-05 NOTE — Telephone Encounter (Signed)
Okay for a refill #30 2 refills

## 2017-05-12 ENCOUNTER — Telehealth: Payer: Self-pay

## 2017-05-12 MED ORDER — ALPRAZOLAM 0.25 MG PO TABS: ORAL_TABLET | ORAL | 2 refills | Status: AC

## 2017-05-12 NOTE — Telephone Encounter (Signed)
This was suppose to be done on 10/4 # 30 with 2 refills per [facility] but not sure this was done so I have called in #30 with 2 refills to pharmacy

## 2017-05-12 NOTE — Telephone Encounter (Signed)
[name] sent request for rx refill of alprazolam. [name] is out of the office. Pt is in assisted living. Sent form back for completion. Manuel [name]

## 2017-05-12 NOTE — Telephone Encounter (Signed)
Please handle this

## 2017-05-25 ENCOUNTER — Ambulatory Visit: Payer: Medicare Other | Admitting: Neurology

## 2017-06-06 IMAGING — CR DG HAND COMPLETE 3+V*L*
3 series · 3 of 3 positions shown · non-contrast
Comparison: None.

CLINICAL DATA: Left hand laceration after injury on ladder. Initial
encounter.

EXAM:
LEFT HAND - COMPLETE 3+ VIEW

[x hand pa left]
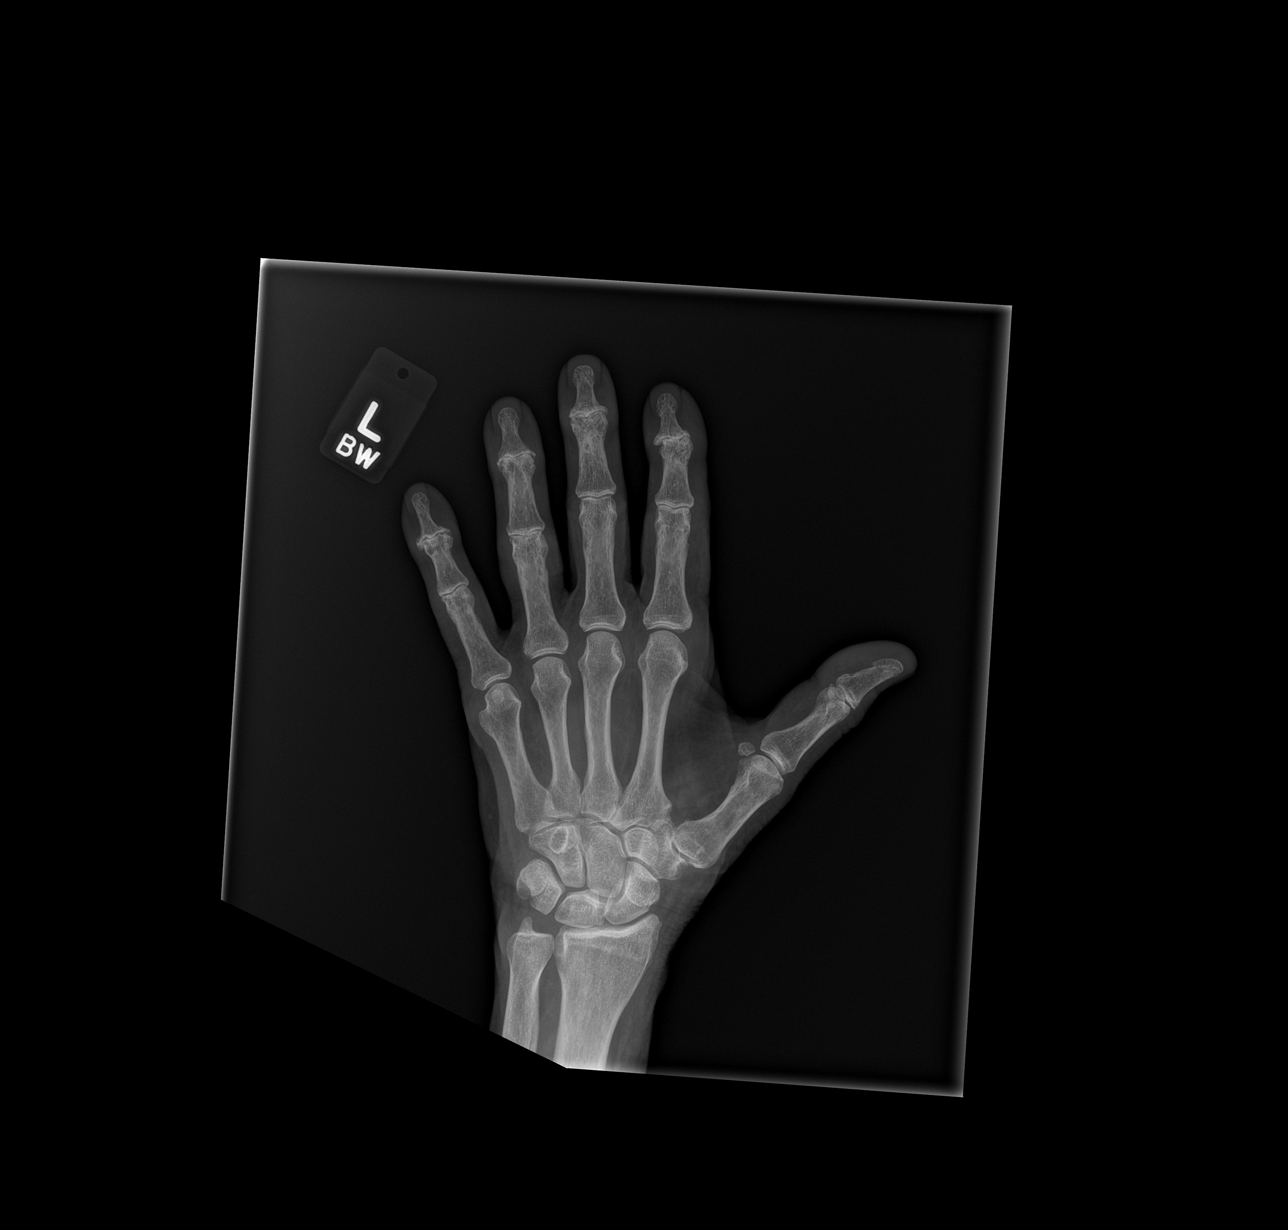

[x hand obl left]
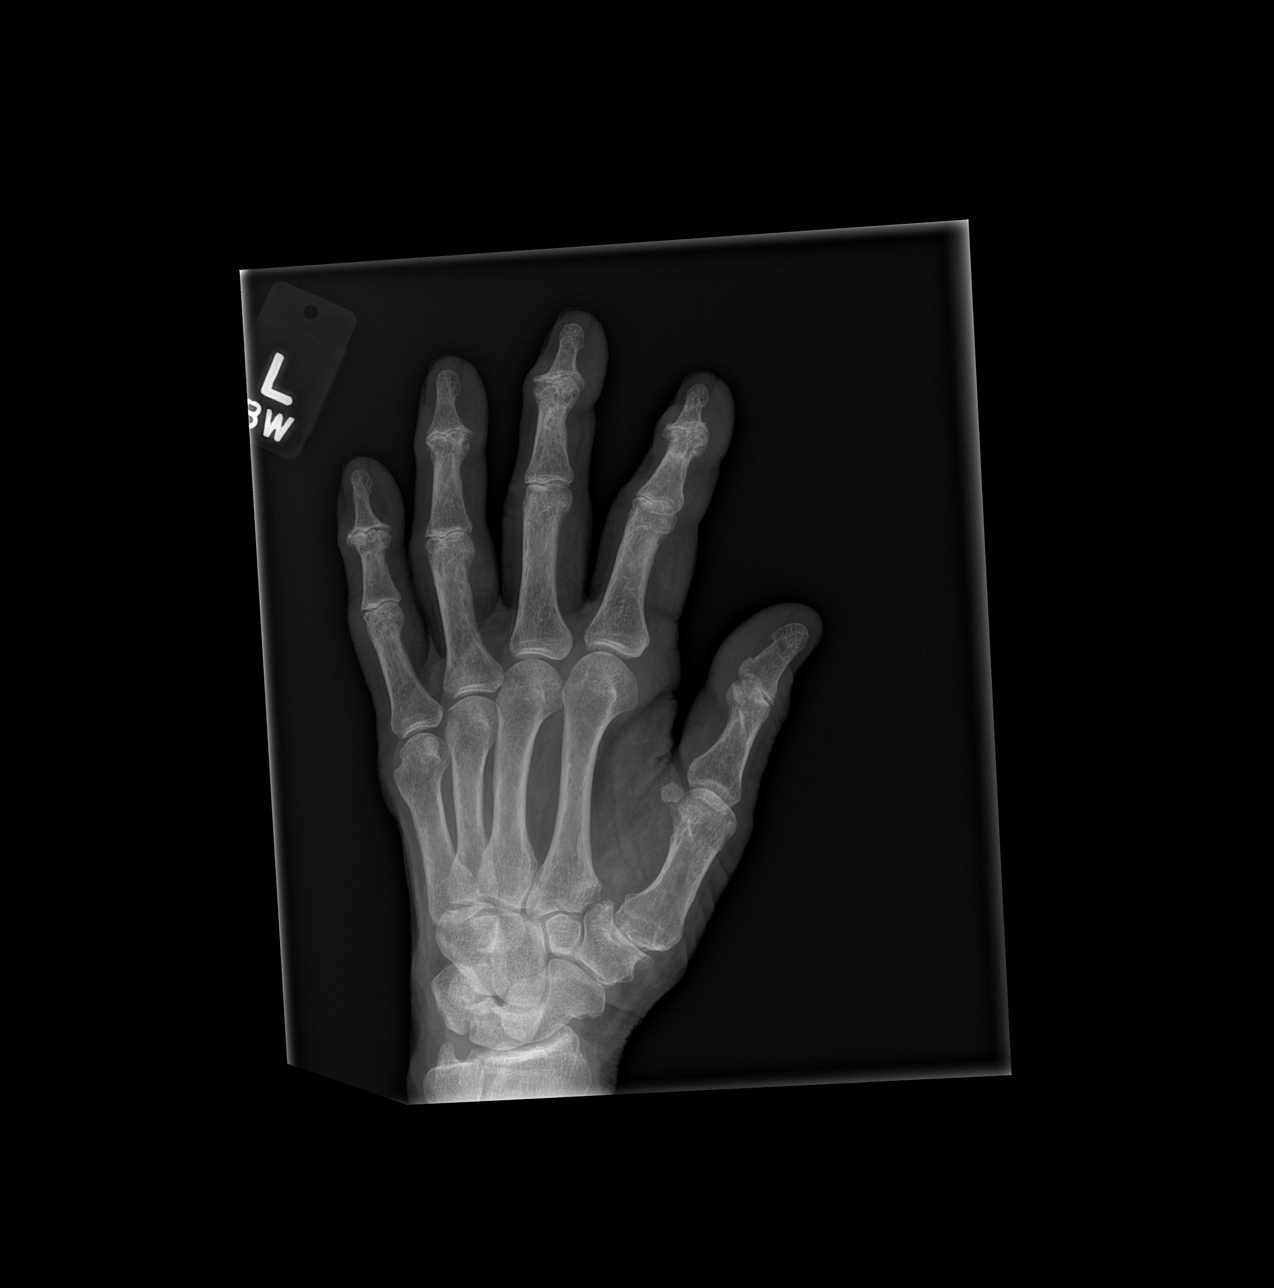

[x hand lat left]
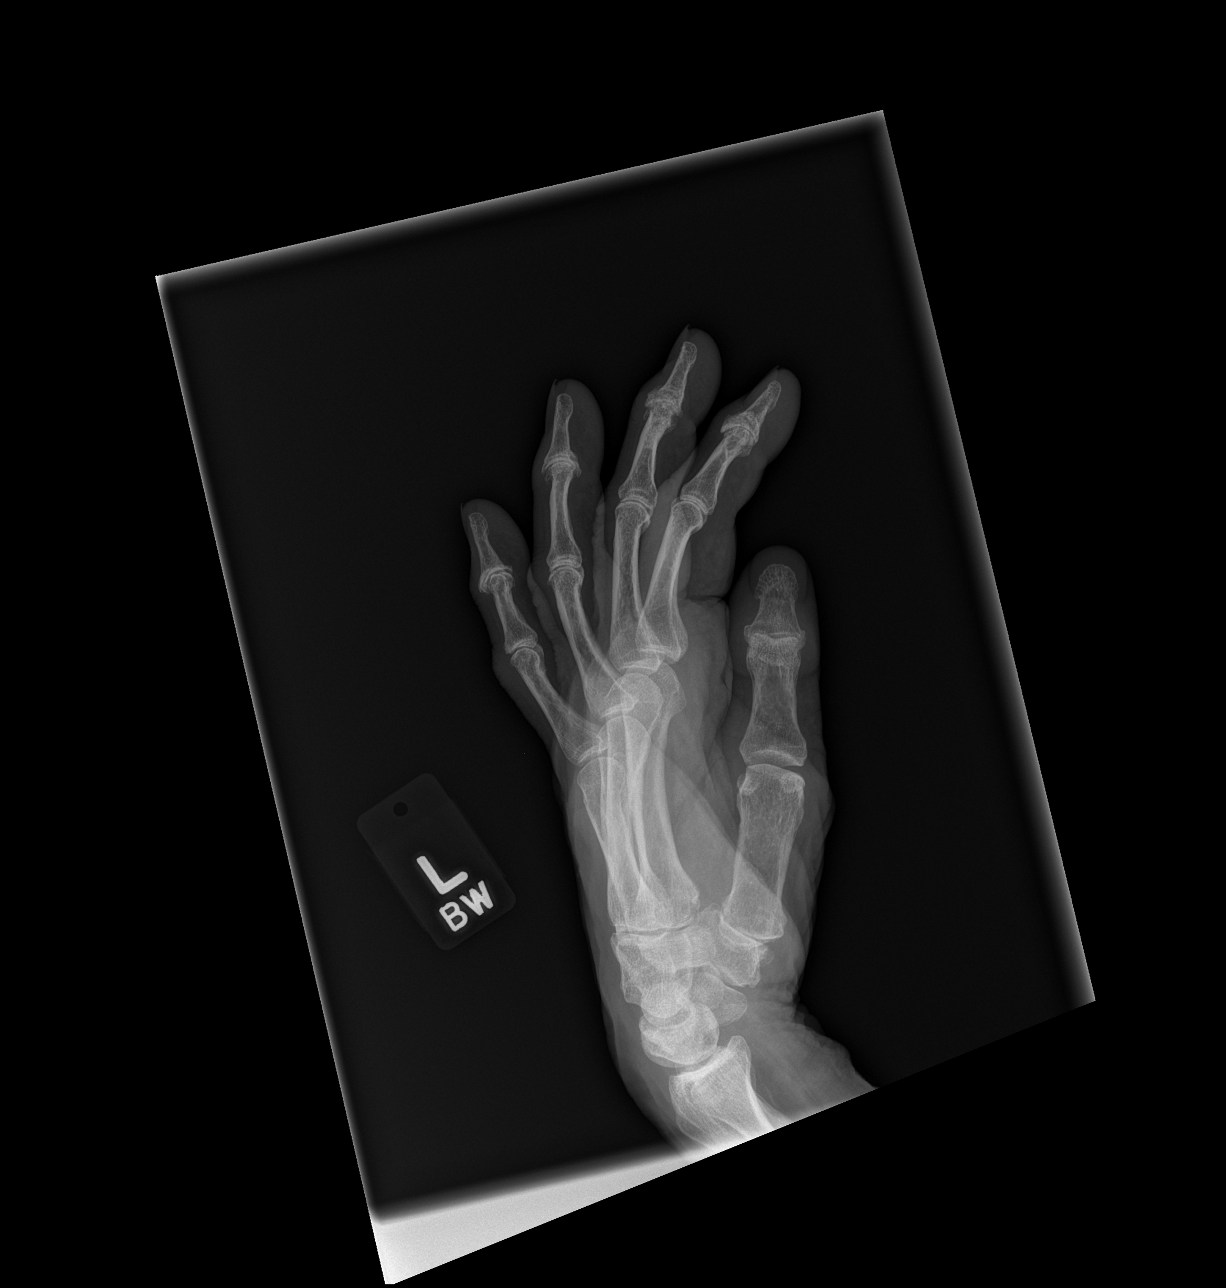

[3 of 3 positions shown; findings below may reference images not displayed]

FINDINGS: There is no evidence of fracture or dislocation. Narrowing and
osteophyte formation is seen involving the second through fifth
distal interphalangeal joints consistent with osteoarthritis. Soft
tissues are unremarkable.
IMPRESSION: Osteoarthritis is seen involving multiple joints. No acute
abnormality seen in the left hand.

## 2017-06-21 ENCOUNTER — Telehealth: Payer: Self-pay | Admitting: Family Medicine

## 2017-06-21 NOTE — Telephone Encounter (Signed)
Recv'd medical records request from [facility] t# [phone] fax # [phone], records faxed

## 2017-06-21 NOTE — Telephone Encounter (Deleted)
Recv'd medical records request from [facility] t# [phone] fax # [phone], records faxed

## 2017-10-14 ENCOUNTER — Ambulatory Visit: Payer: Self-pay | Admitting: Family Medicine

## 2017-10-28 ENCOUNTER — Telehealth: Payer: Self-pay

## 2017-10-28 NOTE — Telephone Encounter (Signed)
Received VM from pt's son, Manuel [name].  He states that pt lost his 1099 form from the social security office.  States that he is trying to complete pt's taxes and was advised to get a letter from [place] stating pt's Dx and that he requires help with just about every thing.  Son states letter can be emailed to [email or web address] or we can call him at [phone] when letter is complete.

## 2017-10-31 ENCOUNTER — Encounter: Payer: Self-pay | Admitting: Neurology

## 2018-01-18 ENCOUNTER — Telehealth: Payer: Self-pay

## 2018-01-18 NOTE — Telephone Encounter (Signed)
Called pt to schedule an awv visit. No answer lvm to call back to office [facility]

## 2018-03-16 ENCOUNTER — Telehealth: Payer: Self-pay

## 2018-03-16 NOTE — Telephone Encounter (Signed)
Called to pt see if he would like to schedule his CPE or med check. No answer. Lvm. Dana

## 2018-12-25 ENCOUNTER — Other Ambulatory Visit: Payer: Self-pay

## 2018-12-25 ENCOUNTER — Emergency Department (HOSPITAL_COMMUNITY)
Admission: EM | Admit: 2018-12-25 | Discharge: 2018-12-25 | Disposition: A | Discharge status: Home / Self Care | Payer: Medicare Other | Attending: Emergency Medicine | Admitting: Emergency Medicine

## 2018-12-25 ENCOUNTER — Encounter (HOSPITAL_COMMUNITY): Payer: Self-pay | Admitting: Emergency Medicine

## 2018-12-25 DIAGNOSIS — Z79899 Other long term (current) drug therapy: Secondary | ICD-10-CM | POA: Insufficient documentation

## 2018-12-25 DIAGNOSIS — Z87891 Personal history of nicotine dependence: Secondary | ICD-10-CM | POA: Diagnosis not present

## 2018-12-25 DIAGNOSIS — I10 Essential (primary) hypertension: Secondary | ICD-10-CM | POA: Diagnosis not present

## 2018-12-25 DIAGNOSIS — L723 Sebaceous cyst: Secondary | ICD-10-CM | POA: Diagnosis not present

## 2018-12-25 DIAGNOSIS — F0390 Unspecified dementia without behavioral disturbance: Secondary | ICD-10-CM | POA: Insufficient documentation

## 2018-12-25 DIAGNOSIS — Z23 Encounter for immunization: Secondary | ICD-10-CM | POA: Insufficient documentation

## 2018-12-25 DIAGNOSIS — R221 Localized swelling, mass and lump, neck: Secondary | ICD-10-CM | POA: Diagnosis present

## 2018-12-25 MED ORDER — TETANUS-DIPHTH-ACELL PERTUSSIS 5-2.5-18.5 LF-MCG/0.5 IM SUSP
0.5000 mL | Freq: Once | INTRAMUSCULAR | Status: AC
  Administered 2018-12-25: 0.5 mL via INTRAMUSCULAR
  Filled 2018-12-25: qty 0.5

## 2018-12-25 MED ORDER — DOXYCYCLINE HYCLATE 100 MG PO CAPS: 100.0000 mg | ORAL_CAPSULE | Freq: Two times a day (BID) | ORAL | 0 refills | Status: AC

## 2018-12-25 MED ORDER — LIDOCAINE-EPINEPHRINE (PF) 2 %-1:200000 IJ SOLN
20.0000 mL | Freq: Once | INTRAMUSCULAR | Status: AC
  Administered 2018-12-25: 20 mL
  Filled 2018-12-25: qty 20

## 2018-12-25 NOTE — ED Triage Notes (Signed)
Pt BIBA from [facility]. Pt has large boil on back of neck per EMS. NP attempted to drain and was unable to get all the fluid out. Fluid is infiltrating into scalp. NP recommends abx and having it "profesionally" cut and sewn

## 2018-12-25 NOTE — ED Notes (Signed)
[facility] called for transport.  

## 2018-12-25 NOTE — ED Notes (Signed)
Lidocaine was placed at bedside for MD.

## 2018-12-25 NOTE — ED Notes (Signed)
Bed: VK12 Expected date:  Expected time:  Means of arrival:  Comments: EMS from facility boil

## 2018-12-25 NOTE — ED Provider Notes (Signed)
[facility] DEPT Provider Note   CSN: [id number] Arrival date & time: 12/25/18  1203    History   Chief Complaint Chief Complaint  Patient presents with  . Abscess    HPI Manuel [name] is a 78 y.o. male.     HPI Patient presents to the emergency room for evaluation of swelling on the back of his neck.  Patient has a history of dementia.  He resides at [facility].  Patient has had swelling in the back of his neck for an unclear period of time.  Patient was evaluated by the nurse practitioner at the facility.  Apparently they tried to drain the abscess but were unsuccessful.  There is sent to the ED for further treatment.  No known fevers. Patient is able to tell me about the pain and discomfort in the back of his neck but is not really able to provide any other specific information. Past Medical History:  Diagnosis Date  . Adrenal adenoma   . Arthritis   . C. difficile colitis   . Family history of anesthesia complication    MOTHER   . GERD (gastroesophageal reflux disease)   . Hypercholesteremia   . Hypertension   . Internal hemorrhoids   . Neuromuscular disorder ([place])    ETIOLOGY UNKNOWN     Patient Active Problem List   Diagnosis Date Noted  . Mild dementia ([place]) 04/07/2016  . Hyperlipidemia 09/05/2015  . Vitamin D deficiency 09/05/2015  . Gastroesophageal reflux disease without esophagitis 02/04/2015  . History of peptic ulcer disease 10/21/2013  . Adrenal incidentaloma ([place]) 09/24/2013  . Aortic atherosclerosis ([place]) 06/01/2013  . Impaired glucose tolerance 05/08/2013  . Right hand weakness 12/10/2010  . Psoriasis 12/10/2010  . Seborrhea 12/10/2010  . Obstructive sleep apnea 01/22/2010  . Hyperlipidemia LDL goal <100 12/25/2009  . Essential hypertension 12/25/2009  . Allergic rhinitis 12/25/2009    Past Surgical History:  Procedure Laterality Date  . COLONOSCOPY     3-4 years ago        Home Medications    Prior  to Admission medications   Medication Sig Start Date End Date Taking? Authorizing Provider  ALPRAZolam (XANAX) 0.25 MG tablet TAKE 1 TABLET BY MOUTH TWICE A DAY AS NEEDED FOR ANXIETY Patient taking differently: Take 0.25 mg by mouth 2 (two) times daily as needed for anxiety.  05/12/17  Yes Denita Lung, MD  atorvastatin (LIPITOR) 10 MG tablet TAKE 1 TABLET (10 MG TOTAL) BY MOUTH DAILY. Patient taking differently: Take 10 mg by mouth daily.  01/27/17  Yes Denita Lung, MD  calcium carbonate (TUMS - DOSED IN MG ELEMENTAL CALCIUM) 500 MG chewable tablet Chew 1 tablet by mouth daily as needed for indigestion or heartburn.   Yes [provider]  divalproex (DEPAKOTE) 125 MG DR tablet Take 125 mg by mouth daily. 11/29/18  Yes [provider]  docusate sodium (STOOL SOFTENER) 100 MG capsule Take 100 mg by mouth daily as needed for mild constipation.   Yes [provider]  donepezil (ARICEPT) 10 MG tablet Take 1 tablet daily Patient taking differently: Take 10 mg by mouth daily.  06/07/16  Yes Denita Lung, MD  famotidine (PEPCID) 20 MG tablet TAKE 1 TABLET BY MOUTH TWICE A DAY Patient taking differently: Take 20 mg by mouth 2 (two) times daily.  02/24/16  Yes Pyrtle, Lajuan Lines, MD  ibuprofen (ADVIL) 200 MG tablet Take 200 mg by mouth every 6 (six) hours as  needed for mild pain.   Yes [provider]  losartan-hydrochlorothiazide (HYZAAR) 50-12.5 MG tablet Take 1 tablet by mouth daily. 11/29/18  Yes [provider]  memantine (NAMENDA) 5 MG tablet TAKE 1 TABLET TWICE A DAY Patient taking differently: Take 5 mg by mouth 2 (two) times daily.  05/02/17  Yes Denita Lung, MD  omeprazole (PRILOSEC) 40 MG capsule Take 40 mg by mouth daily.   Yes [provider]  Simethicone 125 MG CAPS Take 125 mg by mouth every 6 (six) hours as needed for flatulence.   Yes [provider]  doxycycline (VIBRAMYCIN) 100 MG capsule Take 1 capsule (100 mg total) by  mouth 2 (two) times daily. 12/25/18   Dorie Rank, MD    Family History Family History  Problem Relation Age of Onset  . Heart attack Father     Social History Social History   Tobacco Use  . Smoking status: Former Smoker    Packs/day: 1.00    Years: 20.00    Pack years: 20.00    Types: Cigarettes    Last attempt to quit: 05/11/1982    Years since quitting: 36.6  . Smokeless tobacco: Never Used  Substance Use Topics  . Alcohol use: Yes    Alcohol/week: 1.0 standard drinks    Types: 1 Glasses of wine per week    Comment: occasionally  . Drug use: No     Allergies   Ace inhibitors   Review of Systems Review of Systems  All other systems reviewed and are negative.    Physical Exam Updated Vital Signs BP 128/86 (BP Location: Left Arm)   Pulse 87   Temp 99.2 F (37.3 C) (Oral)   Resp 15   Ht 1.803 m (5\' 11" )   Wt 90.7 kg   SpO2 95%   BMI 27.89 kg/m   Physical Exam Vitals signs and nursing note reviewed.  Constitutional:      General: He is not in acute distress.    Appearance: He is well-developed.  HENT:     Head: Normocephalic and atraumatic.     Comments: Raised area of swelling, tenderness and erythema back of neck left side, induration, golf ball sized    Right Ear: External ear normal.     Left Ear: External ear normal.  Eyes:     General: No scleral icterus.       Right eye: No discharge.        Left eye: No discharge.     Conjunctiva/sclera: Conjunctivae normal.  Neck:     Musculoskeletal: Neck supple.     Trachea: No tracheal deviation.  Cardiovascular:     Rate and Rhythm: Normal rate.  Pulmonary:     Effort: Pulmonary effort is normal. No respiratory distress.     Breath sounds: No stridor.  Abdominal:     General: There is no distension.  Musculoskeletal:        General: No swelling or deformity.  Skin:    General: Skin is warm and dry.     Findings: No rash.  Neurological:     Mental Status: He is alert.     Cranial Nerves:  Cranial nerve deficit: no gross deficits.      ED Treatments / Results  Labs (all labs ordered are listed, but only abnormal results are displayed) Labs Reviewed - No data to display  EKG None  Radiology No results found.  Procedures .Marland KitchenIncision and Drainage Date/Time: 12/25/2018 1:45 PM Performed by: Tomi Bamberger,  Wille Glaser, MD Authorized by: Dorie Rank, MD   Consent:    Consent obtained:  Verbal   Consent given by:  Patient   Risks discussed:  Bleeding, incomplete drainage, pain and damage to other organs   Alternatives discussed:  No treatment Universal protocol:    Procedure explained and questions answered to patient or proxy's satisfaction: yes     Relevant documents present and verified: yes     Test results available and properly labeled: yes     Imaging studies available: yes     Required blood products, implants, devices, and special equipment available: yes     Site/side marked: yes     Immediately prior to procedure a time out was called: yes     Patient identity confirmed:  Verbally with patient Location:    Type:  Cyst   Size:  4   Location:  Neck   Neck location:  L posterior Pre-procedure details:    Skin preparation:  Chloraprep Anesthesia (see MAR for exact dosages):    Anesthesia method:  Local infiltration   Local anesthetic:  Lidocaine 1% WITH epi Procedure type:    Complexity:  Complex Procedure details:    Incision types:  Single straight   Incision depth:  Subcutaneous   Scalpel blade:  11   Wound management:  Probed and deloculated, irrigated with saline and extensive cleaning   Drainage characteristics: sebaceous.   Drainage amount:  Copious   Wound treatment:  Wound left open   Packing materials:  None Post-procedure details:    Patient tolerance of procedure:  Tolerated well, no immediate complications Comments:     Large amount of sebaceous..  Minimal purulent type material   (including critical care time)  Medications Ordered in ED  Medications  lidocaine-EPINEPHrine (XYLOCAINE W/EPI) 2 %-1:200000 (PF) injection 20 mL (20 mLs Infiltration Given by Other 12/25/18 1316)  Tdap (BOOSTRIX) injection 0.5 mL (0.5 mLs Intramuscular Given 12/25/18 1303)     Initial Impression / Assessment and Plan / ED Course  I have reviewed the triage vital signs and the nursing notes.  Pertinent labs & imaging results that were available during my care of the patient were reviewed by me and considered in my medical decision making (see chart for details).      Patient presented ED for evaluation of large area of neck swelling.  Bedside ultrasound confirmed fluid collection.  Incision and drainage performed.  Large amount of sebaceous type material extruded.  Small amount of mucopurulent drainage will start the patient on antibiotics.  Patient may ultimately need follow-up with a dermatologist for complete excision of the cyst.  Final Clinical Impressions(s) / ED Diagnoses   Final diagnoses:  Sebaceous cyst    ED Discharge Orders         Ordered    doxycycline (VIBRAMYCIN) 100 MG capsule  2 times daily     12/25/18 1344           Dorie Rank, MD 12/25/18 1347

## 2018-12-25 NOTE — Discharge Instructions (Addendum)
Take the antibiotics as prescribed, follow-up with a dermatologist for further evaluation and possible complete removal of the sebaceous cyst

## 2018-12-25 NOTE — ED Notes (Signed)
Patient was unable to sign for discharge paper work due to mental status. Patient's facility was given discharge instructions and verbalized understanding.

## 2018-12-28 ENCOUNTER — Telehealth: Payer: Self-pay | Admitting: Family Medicine

## 2018-12-28 NOTE — Telephone Encounter (Signed)
Received a call from [place] with [facility]. She is informing us that services will start with pt tomorrow. She can be reached at [phone].

## 2019-08-06 DIAGNOSIS — Z1159 Encounter for screening for other viral diseases: Secondary | ICD-10-CM | POA: Diagnosis not present

## 2019-08-06 DIAGNOSIS — Z20828 Contact with and (suspected) exposure to other viral communicable diseases: Secondary | ICD-10-CM | POA: Diagnosis not present

## 2019-08-09 DIAGNOSIS — Z20828 Contact with and (suspected) exposure to other viral communicable diseases: Secondary | ICD-10-CM | POA: Diagnosis not present

## 2019-08-09 DIAGNOSIS — Z1159 Encounter for screening for other viral diseases: Secondary | ICD-10-CM | POA: Diagnosis not present

## 2019-08-15 ENCOUNTER — Telehealth: Payer: Self-pay | Admitting: Family Medicine

## 2019-08-15 NOTE — Telephone Encounter (Signed)
Request for vaccine information sent to [facility] [place] for patient .  Fax [phone] sent same

## 2019-08-16 DIAGNOSIS — Z1159 Encounter for screening for other viral diseases: Secondary | ICD-10-CM | POA: Diagnosis not present

## 2019-08-16 DIAGNOSIS — Z20828 Contact with and (suspected) exposure to other viral communicable diseases: Secondary | ICD-10-CM | POA: Diagnosis not present

## 2019-08-20 DIAGNOSIS — Z20828 Contact with and (suspected) exposure to other viral communicable diseases: Secondary | ICD-10-CM | POA: Diagnosis not present

## 2019-08-20 DIAGNOSIS — Z1159 Encounter for screening for other viral diseases: Secondary | ICD-10-CM | POA: Diagnosis not present

## 2019-08-22 DIAGNOSIS — R451 Restlessness and agitation: Secondary | ICD-10-CM | POA: Diagnosis not present

## 2019-08-22 DIAGNOSIS — F0391 Unspecified dementia with behavioral disturbance: Secondary | ICD-10-CM | POA: Diagnosis not present

## 2019-08-22 DIAGNOSIS — G4700 Insomnia, unspecified: Secondary | ICD-10-CM | POA: Diagnosis not present

## 2019-08-22 DIAGNOSIS — F29 Unspecified psychosis not due to a substance or known physiological condition: Secondary | ICD-10-CM | POA: Diagnosis not present

## 2019-08-23 DIAGNOSIS — Z1159 Encounter for screening for other viral diseases: Secondary | ICD-10-CM | POA: Diagnosis not present

## 2019-08-23 DIAGNOSIS — Z20828 Contact with and (suspected) exposure to other viral communicable diseases: Secondary | ICD-10-CM | POA: Diagnosis not present

## 2019-08-30 DIAGNOSIS — Z20828 Contact with and (suspected) exposure to other viral communicable diseases: Secondary | ICD-10-CM | POA: Diagnosis not present

## 2019-08-30 DIAGNOSIS — Z1159 Encounter for screening for other viral diseases: Secondary | ICD-10-CM | POA: Diagnosis not present

## 2019-09-03 DIAGNOSIS — Z1159 Encounter for screening for other viral diseases: Secondary | ICD-10-CM | POA: Diagnosis not present

## 2019-09-03 DIAGNOSIS — Z20828 Contact with and (suspected) exposure to other viral communicable diseases: Secondary | ICD-10-CM | POA: Diagnosis not present

## 2019-09-06 DIAGNOSIS — Z1159 Encounter for screening for other viral diseases: Secondary | ICD-10-CM | POA: Diagnosis not present

## 2019-09-06 DIAGNOSIS — Z20828 Contact with and (suspected) exposure to other viral communicable diseases: Secondary | ICD-10-CM | POA: Diagnosis not present

## 2019-09-10 DIAGNOSIS — G4701 Insomnia due to medical condition: Secondary | ICD-10-CM | POA: Diagnosis not present

## 2019-09-10 DIAGNOSIS — Z1159 Encounter for screening for other viral diseases: Secondary | ICD-10-CM | POA: Diagnosis not present

## 2019-09-10 DIAGNOSIS — Z20828 Contact with and (suspected) exposure to other viral communicable diseases: Secondary | ICD-10-CM | POA: Diagnosis not present

## 2019-09-10 DIAGNOSIS — Z79899 Other long term (current) drug therapy: Secondary | ICD-10-CM | POA: Diagnosis not present

## 2019-09-10 DIAGNOSIS — F0391 Unspecified dementia with behavioral disturbance: Secondary | ICD-10-CM | POA: Diagnosis not present

## 2019-09-10 DIAGNOSIS — I10 Essential (primary) hypertension: Secondary | ICD-10-CM | POA: Diagnosis not present

## 2019-09-13 DIAGNOSIS — Z20828 Contact with and (suspected) exposure to other viral communicable diseases: Secondary | ICD-10-CM | POA: Diagnosis not present

## 2019-09-13 DIAGNOSIS — Z1159 Encounter for screening for other viral diseases: Secondary | ICD-10-CM | POA: Diagnosis not present

## 2019-09-17 DIAGNOSIS — Z20828 Contact with and (suspected) exposure to other viral communicable diseases: Secondary | ICD-10-CM | POA: Diagnosis not present

## 2019-09-17 DIAGNOSIS — Z1159 Encounter for screening for other viral diseases: Secondary | ICD-10-CM | POA: Diagnosis not present

## 2019-09-20 DIAGNOSIS — Z20828 Contact with and (suspected) exposure to other viral communicable diseases: Secondary | ICD-10-CM | POA: Diagnosis not present

## 2019-09-20 DIAGNOSIS — Z1159 Encounter for screening for other viral diseases: Secondary | ICD-10-CM | POA: Diagnosis not present

## 2019-09-25 DIAGNOSIS — G4700 Insomnia, unspecified: Secondary | ICD-10-CM | POA: Diagnosis not present

## 2019-09-25 DIAGNOSIS — F0391 Unspecified dementia with behavioral disturbance: Secondary | ICD-10-CM | POA: Diagnosis not present

## 2019-09-25 DIAGNOSIS — F29 Unspecified psychosis not due to a substance or known physiological condition: Secondary | ICD-10-CM | POA: Diagnosis not present

## 2019-09-25 DIAGNOSIS — R451 Restlessness and agitation: Secondary | ICD-10-CM | POA: Diagnosis not present

## 2019-09-27 DIAGNOSIS — Z20828 Contact with and (suspected) exposure to other viral communicable diseases: Secondary | ICD-10-CM | POA: Diagnosis not present

## 2019-09-27 DIAGNOSIS — Z1159 Encounter for screening for other viral diseases: Secondary | ICD-10-CM | POA: Diagnosis not present

## 2019-10-01 DIAGNOSIS — Z1159 Encounter for screening for other viral diseases: Secondary | ICD-10-CM | POA: Diagnosis not present

## 2019-10-01 DIAGNOSIS — Z20828 Contact with and (suspected) exposure to other viral communicable diseases: Secondary | ICD-10-CM | POA: Diagnosis not present

## 2019-10-04 DIAGNOSIS — Z1159 Encounter for screening for other viral diseases: Secondary | ICD-10-CM | POA: Diagnosis not present

## 2019-10-04 DIAGNOSIS — Z20828 Contact with and (suspected) exposure to other viral communicable diseases: Secondary | ICD-10-CM | POA: Diagnosis not present

## 2019-10-08 DIAGNOSIS — Z1159 Encounter for screening for other viral diseases: Secondary | ICD-10-CM | POA: Diagnosis not present

## 2019-10-08 DIAGNOSIS — Z20828 Contact with and (suspected) exposure to other viral communicable diseases: Secondary | ICD-10-CM | POA: Diagnosis not present

## 2019-10-11 DIAGNOSIS — Z20828 Contact with and (suspected) exposure to other viral communicable diseases: Secondary | ICD-10-CM | POA: Diagnosis not present

## 2019-10-11 DIAGNOSIS — Z1159 Encounter for screening for other viral diseases: Secondary | ICD-10-CM | POA: Diagnosis not present

## 2019-10-15 DIAGNOSIS — Z20828 Contact with and (suspected) exposure to other viral communicable diseases: Secondary | ICD-10-CM | POA: Diagnosis not present

## 2019-10-15 DIAGNOSIS — Z1159 Encounter for screening for other viral diseases: Secondary | ICD-10-CM | POA: Diagnosis not present

## 2019-10-18 DIAGNOSIS — Z20828 Contact with and (suspected) exposure to other viral communicable diseases: Secondary | ICD-10-CM | POA: Diagnosis not present

## 2019-10-18 DIAGNOSIS — Z1159 Encounter for screening for other viral diseases: Secondary | ICD-10-CM | POA: Diagnosis not present

## 2019-10-22 DIAGNOSIS — Z1159 Encounter for screening for other viral diseases: Secondary | ICD-10-CM | POA: Diagnosis not present

## 2019-10-22 DIAGNOSIS — Z20828 Contact with and (suspected) exposure to other viral communicable diseases: Secondary | ICD-10-CM | POA: Diagnosis not present

## 2019-10-25 DIAGNOSIS — Z1159 Encounter for screening for other viral diseases: Secondary | ICD-10-CM | POA: Diagnosis not present

## 2019-10-25 DIAGNOSIS — Z20828 Contact with and (suspected) exposure to other viral communicable diseases: Secondary | ICD-10-CM | POA: Diagnosis not present

## 2019-10-29 DIAGNOSIS — Z09 Encounter for follow-up examination after completed treatment for conditions other than malignant neoplasm: Secondary | ICD-10-CM | POA: Diagnosis not present

## 2019-10-29 DIAGNOSIS — Z1159 Encounter for screening for other viral diseases: Secondary | ICD-10-CM | POA: Diagnosis not present

## 2019-10-29 DIAGNOSIS — Z20828 Contact with and (suspected) exposure to other viral communicable diseases: Secondary | ICD-10-CM | POA: Diagnosis not present

## 2019-10-29 DIAGNOSIS — Z79899 Other long term (current) drug therapy: Secondary | ICD-10-CM | POA: Diagnosis not present

## 2019-10-29 DIAGNOSIS — F0391 Unspecified dementia with behavioral disturbance: Secondary | ICD-10-CM | POA: Diagnosis not present

## 2019-10-29 DIAGNOSIS — I10 Essential (primary) hypertension: Secondary | ICD-10-CM | POA: Diagnosis not present

## 2019-10-29 DIAGNOSIS — K219 Gastro-esophageal reflux disease without esophagitis: Secondary | ICD-10-CM | POA: Diagnosis not present
